# Patient Record
Sex: Female | Born: 1960 | Race: White | Hispanic: No | Marital: Married | Smoking: Never smoker
Health system: Southern US, Community
[De-identification: ages and names within clinical notes are randomized; demographics above are authoritative.]

## PROBLEM LIST (undated history)

## (undated) DIAGNOSIS — J302 Other seasonal allergic rhinitis: Secondary | ICD-10-CM

## (undated) DIAGNOSIS — H8109 Meniere's disease, unspecified ear: Secondary | ICD-10-CM

## (undated) HISTORY — DX: Meniere's disease, unspecified ear: H81.09

## (undated) HISTORY — DX: Other seasonal allergic rhinitis: J30.2

---
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
## 1981-08-16 HISTORY — PX: HAND SURGERY: SHX662

## 2004-06-18 ENCOUNTER — Ambulatory Visit: Payer: Self-pay | Admitting: Family Medicine

## 2005-07-07 ENCOUNTER — Ambulatory Visit: Payer: Self-pay | Admitting: Unknown Physician Specialty

## 2006-09-27 ENCOUNTER — Ambulatory Visit: Payer: Self-pay | Admitting: Unknown Physician Specialty

## 2007-10-03 ENCOUNTER — Ambulatory Visit: Payer: Self-pay | Admitting: Unknown Physician Specialty

## 2008-06-13 ENCOUNTER — Emergency Department: Payer: Self-pay | Admitting: Emergency Medicine

## 2008-11-08 ENCOUNTER — Ambulatory Visit: Payer: Self-pay | Admitting: Unknown Physician Specialty

## 2008-11-19 ENCOUNTER — Ambulatory Visit: Payer: Self-pay | Admitting: Unknown Physician Specialty

## 2009-12-02 ENCOUNTER — Ambulatory Visit: Payer: Self-pay | Admitting: Unknown Physician Specialty

## 2009-12-23 ENCOUNTER — Ambulatory Visit: Payer: Self-pay | Admitting: Unknown Physician Specialty

## 2010-12-25 ENCOUNTER — Ambulatory Visit: Payer: Self-pay | Admitting: Unknown Physician Specialty

## 2012-05-01 ENCOUNTER — Ambulatory Visit: Payer: Self-pay | Admitting: Physician Assistant

## 2015-08-13 ENCOUNTER — Other Ambulatory Visit: Payer: Self-pay | Admitting: Neurology

## 2015-08-13 DIAGNOSIS — G43809 Other migraine, not intractable, without status migrainosus: Secondary | ICD-10-CM

## 2015-08-13 DIAGNOSIS — G43109 Migraine with aura, not intractable, without status migrainosus: Principal | ICD-10-CM

## 2015-09-04 ENCOUNTER — Ambulatory Visit
Admission: RE | Admit: 2015-09-04 | Discharge: 2015-09-04 | Disposition: A | Discharge status: Home / Self Care | Payer: BLUE CROSS/BLUE SHIELD | Source: Ambulatory Visit | Attending: Neurology | Admitting: Neurology

## 2015-09-04 DIAGNOSIS — H9201 Otalgia, right ear: Secondary | ICD-10-CM | POA: Diagnosis not present

## 2015-09-04 DIAGNOSIS — J029 Acute pharyngitis, unspecified: Secondary | ICD-10-CM | POA: Diagnosis not present

## 2015-09-04 DIAGNOSIS — H9311 Tinnitus, right ear: Secondary | ICD-10-CM | POA: Insufficient documentation

## 2015-09-04 DIAGNOSIS — R42 Dizziness and giddiness: Secondary | ICD-10-CM | POA: Diagnosis not present

## 2015-09-04 DIAGNOSIS — R51 Headache: Secondary | ICD-10-CM | POA: Insufficient documentation

## 2015-09-04 DIAGNOSIS — G43109 Migraine with aura, not intractable, without status migrainosus: Secondary | ICD-10-CM | POA: Diagnosis not present

## 2015-09-04 DIAGNOSIS — G43809 Other migraine, not intractable, without status migrainosus: Secondary | ICD-10-CM

## 2015-09-04 MED ORDER — GADOBENATE DIMEGLUMINE 529 MG/ML IV SOLN
15.0000 mL | Freq: Once | INTRAVENOUS | Status: AC | PRN
  Administered 2015-09-04: 14 mL via INTRAVENOUS

## 2016-06-23 ENCOUNTER — Other Ambulatory Visit: Payer: Self-pay | Admitting: Physician Assistant

## 2016-06-23 DIAGNOSIS — Z1231 Encounter for screening mammogram for malignant neoplasm of breast: Secondary | ICD-10-CM

## 2016-07-21 ENCOUNTER — Ambulatory Visit: Payer: BLUE CROSS/BLUE SHIELD | Attending: Physician Assistant

## 2016-07-21 DIAGNOSIS — R42 Dizziness and giddiness: Secondary | ICD-10-CM | POA: Insufficient documentation

## 2016-07-21 DIAGNOSIS — R2681 Unsteadiness on feet: Secondary | ICD-10-CM | POA: Diagnosis present

## 2016-07-21 NOTE — Therapy (Signed)
[place] MAIN [facility] SERVICES [address] [place], [place], [address] Phone: [phone]   Fax:  [phone]  Physical Therapy Evaluation  Patient Details  Name: Helen Trevino MRN: [id number] Date of Birth: October 10, 1960 Referring Provider: Paulita Cradle PA-C  Encounter Date: 07/21/2016      PT End of Session - 07/21/16 1640    Visit Number 1   Number of Visits 5   Date for PT Re-Evaluation 08/18/16   Authorization Type no go codes   PT Start Time 0800   PT Stop Time 0900   PT Time Calculation (min) 60 min   Equipment Utilized During Treatment Gait belt   Activity Tolerance Patient tolerated treatment well   Behavior During Therapy [facility] for tasks assessed/performed      History reviewed. No pertinent past medical history.  History reviewed. No pertinent surgical history.  There were no vitals filed for this visit.       Subjective Assessment - 07/21/16 1635    Subjective Vestibular migraines   Pertinent History Pt reports she has a history of vestibular migraines for approximately 20 years. She gets both dizziness and vertigo prior to, during, and following her migraines. Pt reports that she has been to ENT twice and tested negative for positional vertigo. She has also had a VNG at ENT which was negative for peripheral disorders. She has also seen ENT at [facility] who ruled her out for Meniere's disease. Pt is working on a food diary currently to try to help in finding triggers related to her migraines. She knows that raw onions, bananas, and citrus bring on her migraines. Pt has never tried vestibular therapy before. She has a remote history of PT for back pain.      Limitations Walking   Diagnostic tests MRI: WNL, normal IAC   Patient Stated Goals Decrease dizziness and improve unsteadiness with walking   Currently in Pain? No/denies            [facility] PT Assessment - 07/21/16 0846      Assessment   Medical Diagnosis Vertigo    Referring Provider Paulita Cradle PA-C   Onset Date/Surgical Date 07/21/86  Approximate   Next MD Visit Not reported on this date   Prior Therapy Remote history of PT for back pain     Precautions   Precautions None     Restrictions   Weight Bearing Restrictions No     Balance Screen   Has the patient fallen in the past 6 months No   Has the patient had a decrease in activity level because of a fear of falling?  No   Is the patient reluctant to leave their home because of a fear of falling?  No     Home Ecologist residence   Living Arrangements Spouse/significant other   Available Help at Discharge Family     Prior Function   Level of [employer] at home   Leisure Travel and [place]   Overall Cognitive Status Within Functional Limits for tasks assessed     Observation/Other Assessments   Other Surveys  Other Surveys     Standardized Balance Assessment   Standardized Balance Assessment Dynamic Gait Index     Dynamic Gait Index   Level Surface Normal   Change in Gait Speed Normal   Gait with Horizontal Head Turns Moderate Impairment   Gait with Vertical Head Turns  Moderate Impairment   Gait and Pivot Turn Normal   Step Over Obstacle Normal   Step Around Obstacles Normal   Steps Mild Impairment   Total Score 19       VESTIBULAR AND BALANCE EVALUATION  Onset Date: 20 years ago  HISTORY:  Subjective history of current problem: Pt reports she has a history of vestibular migraines for approximately 20 years. She gets both dizziness and vertigo prior to, during, and following her migraines. Pt reports that she has been to ENT twice and tested negative for positional vertigo. She has also had a VNG at ENT which was negative for peripheral disorders. She has also seen ENT at [facility] who ruled her out for Meniere's disease. Pt is working on a food diary currently to try to help in finding triggers  related to her migraines. She knows that raw onions, bananas, and citrus bring on her migraines. Pt has never tried vestibular therapy before. She has a remote history of PT for back pain.    Description of dizziness: (vertigo, unsteadiness, lightheadedness, falling, general unsteadiness, aural fullness): vertigo, unsteadiness Frequency: Multiple times per week, there are periods where she experiences symptoms daily Duration: varies from hours to days Symptom nature: (motion provoked/positional/spontaneous/constant, variable, intermittent): motion-provoked, positional, spontaneous, constant  Provocative Factors: travel, food, migraines Easing Factors: sleep, phenergan, ibuprofen, Topomax, pt states she doesn't do well with typical migraine meds because they make her heart race which makes her dizziness worse.   Progression of symptoms: (better, worse, no change since onset) variable. Pt states she has had periods where they are better temporarily and other times when they are much worse. History of similar episodes: Ongoing for approximately 20 years  Falls (yes/no): No  Number of falls in past 6 months: No   Prior Functional Level: Independent   Auditory complaints (tinnitus, pain, drainage): tinnitis (more on right side), migraines start with R ear pain, no drainage from ears or aural fullness. Reports times when she feels like she has trouble hearing in crowded environments. Vision (last eye exam, diplopia, recent changes): blurring of vision during migraines, light sensitivity, denies diplopia, last eye exam 1 year ago (optomotrist added coating to reduce blue light to glasses);  Current Symptoms: vertigo, N & V, concentration issues, headache, rocking, general unsteadiness, imbalance, dizzy, migraines Denies: dysarthria, dysphagia, bowel bladder control, recent weight loss/gain, lightheadedness, oscillopsia  Review of systems negative for red flags.   EXAMINATION  POSTURE:   WNL  NEUROLOGICAL SCREEN: (2+ unless otherwise noted.) N=normal  Ab=abnormal  Level Dermatome R L Myotome R L Reflex R L  C3 Anterior Neck N N Sidebend C2-3 N N Jaw CN V    C4 Top of Shoulder N N Shoulder Shrug C4 N N Hoffman's UMN    C5 Lateral Upper Arm N N Shoulder ABD C4-5 N N Biceps C5-6    C6 Lateral Arm/ Thumb N N Arm Flex/ Wrist Ext C5-6 N N Brachiorad. C5-6    C7 Middle Finger N N Arm Ext//Wrist Flex C6-7 N N Triceps C7    C8 4th & 5th Finger N N Flex/ Ext Carpi Ulnaris C8 N N Patella    T1 Medial Arm N N Interossei T1 N N Gastrocnemius    L2 Medial thigh/groin N N Illiopsoas (L2-3) N N     L3 Lower thigh/med.knee N N Quadriceps (L3-4) N N Patellar (L3-4)    L4 Medial leg/lat thigh N N Tibialis Ant (L4-5) N N     L5 Lat.  leg & dorsal foot N N EHL (L5) N N     S1 post/lat foot/thigh/leg N N Gastrocnemius (S1-2) N N Gastrocnemius (S1)    S2 Post./med. thigh & leg N N Hamstrings (L4-S3) N N Babinski     Reflex testing deferred at this time  SOMATOSENSORY:         Sensation           Intact      Diminished         Absent  Light touch Normal    Any N & T in extremities or weakness: Intact      COORDINATION: Finger to Nose: Normal Pronator Drift: Normal   MUSCULOSKELETAL SCREEN: Cervical Spine ROM: WFL, slow and guarded with pulling in posterior neck during flexion;   ROM: WNL  MMT: Grossly 4+ to 5/5 throughout UE/LE. No focal weakness.   Functional Mobility: Independent for transfers and ambulation without assistive device.   Gait: Scanning of visual environment with gait is: decreased. Pt reports she is self limiting with head turns during ambulation because she knows that it causes her to become unsteady  Balance: Severe gait deviations with horizontal and vertical head turns during ambulation   OCULOMOTOR / VESTIBULAR TESTING:  Oculomotor Exam- Room Light  Normal Abnormal Comments  Ocular Alignment N    Ocular ROM N    Spontaneous Nystagmus N    End-Gaze  Nystagmus N  Appropriate end-range  Smooth Pursuit N  Pt reports some nausea  Saccades N    VOR  A Reports dizziness and nausea.  VOR Cancellation N    Left Head Thrust  A Positive 2/2 trials  Right Head Thrust N    Head Shaking Nystagmus N    Static Acuity 20/13    Dynamic Acuity 20/20  Two line loss    Oculomotor Exam- Fixation Suppressed: Deferred due to bright lights triggering migraines  BPPV TESTS:  Symptoms Duration Intensity Nystagmus  L Dix-Hallpike Negative   Negative  R Dix-Hallpike Negative   Negative  L Head Roll Negative   Negative  R Head Roll Negative   Negative  L Sidelying Test      R Sidelying Test        FUNCTIONAL OUTCOME MEASURES:  Results Comments  DHI  Did not complete correctly  ABC Scale  Will complete at next session  DGI 19/24 Difficulty with head turns  10 meter Walking Speed >1.2 m/s WNL   POSTURAL CONTROL TESTS:   Clinical Test of Sensory Interaction for Balance    (CTSIB):  CONDITION TIME STRATEGY SWAY  Eyes open, firm surface 30s  1+  Eyes closed, firm surface 30s  2+  Eyes open, foam surface 30s  2+  Eyes closed, foam surface 4s  4+      TREATMENT  NEUROMUSCULAR RE-EDUCATION Pt issued VOR x 1 horizontal in sitting. Perform with patient x 60 seconds. She reports significant increase in her dizziness and nausea after approximately 30 seconds. Pt was told to limit HEP to 30 seconds. Handout with education provided to patient and spouse.        .                     PT Education - 07/21/16 1639    Education provided Yes   Education Details HEP, plan of care   Person(s) Educated Patient;Spouse   Methods Explanation   Comprehension Verbalized understanding  PT Long Term Goals - 07/21/16 1702      PT LONG TERM GOAL #1   Title  Pt will be independent with HEP in order to improve strength and balance in order to decrease fall risk and improve function at home and work   Time 4   Period Weeks    Status New     PT LONG TERM GOAL #2   Title Pt will report at least 50% reduction in symptoms in order to improve function at home and ability to travel/hike   Time 4   Period Weeks   Status New     PT LONG TERM GOAL #3   Title  Pt will improve DGI by at least 3 points in order to demonstrate clinically significant improvement in balance and decreased risk for falls    Baseline 07/21/16: 19/24   Time 4   Period Weeks   Status New               Plan - 07/21/16 1641    Clinical Impression Statement Pt is a pleasant 55 yo female referred for vertigo. She reports a 20 year history of vestibular migraines which trigger her vertigo and dizziness. She has been to ENT twice and tested negative both times for positional vertigo. She has also had a VNG which was negative. 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 However pt reports negative VNG testing in the past. Her dynamic visual acuity is also only a two line loss which is not significant for a peripheral loss. She struggles with balance on compliant surfaces with eyes closed and demonstrates severe lateral gait deviations and gait slowing with horizontal and vertical head turns. Pt will benefit from trial of skilled vestibular therapy to improve dizziness and head turns with ambulation   Rehab Potential Fair   Clinical Impairments Affecting Rehab Potential Positive: motivation, age; Negative: chronicity, vestibular migraines   PT Frequency 1x / week   PT Duration 4 weeks   PT Treatment/Interventions ADLs/Self Care Home Management;Canalith Repostioning;Cryotherapy;Electrical Stimulation;Iontophoresis 4mg /ml Dexamethasone;Traction;Moist Heat;Ultrasound;DME Instruction;Gait training;Stair training;Functional mobility training;Therapeutic activities;Therapeutic exercise;Balance training;Neuromuscular re-education;Patient/family education;Manual techniques;Passive range  of motion;Vestibular   PT Next Visit Plan Have pt complete ABC and DHI (did not complete DHI correctly), progress VOR and issue a habituation exercise to her HEP   PT Home Exercise Plan VOR x 1 horizontal in sitting 30 seconds x 3, 4 times/day, encouraged to modify as appropriate to minimize severity of symptoms and migraines   Consulted and Agree with Plan of Care Patient;Family member/caregiver   Family Member Consulted Husband      Patient will benefit from skilled therapeutic intervention in order to improve the following deficits and impairments:  Dizziness, Decreased balance  Visit Diagnosis: Dizziness and giddiness - Plan: PT plan of care cert/re-cert  Unsteadiness on feet - Plan: PT plan of care cert/re-cert     Problem List There are no active problems to display for this patient.  Phillips Grout PT, DPT   Ryle Buscemi 07/21/2016, 5:25 PM  [place] MAIN [facility] SERVICES [address] [place], [place], [address] Phone: [phone]   Fax:  [phone]  Name: Helen Trevino MRN: [id number] Date of Birth: [birth year]

## 2016-07-28 ENCOUNTER — Ambulatory Visit: Payer: BLUE CROSS/BLUE SHIELD

## 2016-07-28 DIAGNOSIS — R42 Dizziness and giddiness: Secondary | ICD-10-CM

## 2016-07-28 DIAGNOSIS — R2681 Unsteadiness on feet: Secondary | ICD-10-CM

## 2016-07-28 NOTE — Patient Instructions (Signed)
Feet Heel-Toe "Semi-Tandem", Head Motion - Eyes Open    With eyes open, right foot directly in front of the other but slightly out to the side, move head slowly: left and right for 30 seconds, up and down for 30 seconds, and then turn head with body for 30 seconds. Switch forward leg and repeat entire sequence. Perform 3-4 times/day.    Gaze Stabilization  Progress to standing with feet together on a firm surface with target on a plain background. Start with 30 seconds x 3, 4 times/day and progress until you can perform for 1 full minute

## 2016-07-28 NOTE — Therapy (Signed)
[place] MAIN [facility] SERVICES [address] [place], [place], [address] Phone: [phone]   Fax:  [phone]  Physical Therapy Treatment  Patient Details  Name: Helen Trevino MRN: [id number] Date of Birth: 03/13/1961 Referring Provider: Paulita Cradle PA-C  Encounter Date: 07/28/2016      PT End of Session - 07/28/16 1522    Visit Number 2   Number of Visits 5   Date for PT Re-Evaluation 08/18/16   Authorization Type no go codes   PT Start Time 11-19-16   PT Stop Time 1600   PT Time Calculation (min) 42 min   Equipment Utilized During Treatment Gait belt   Activity Tolerance Patient tolerated treatment well   Behavior During Therapy [facility] for tasks assessed/performed      History reviewed. No pertinent past medical history.  History reviewed. No pertinent surgical history.  There were no vitals filed for this visit.      Subjective Assessment - 07/28/16 1519    Subjective Pt reports that she was able to progressively increase VOR x 1 horizontal up to 1 full minute. Reports decreasing severity of symptoms with increased time during exercise. She is consistently performing her HEP. No specific questions or concerns at this time.    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 She gets both dizziness and vertigo prior to, during, and following her migraines. Pt reports that she has been to ENT twice and tested negative for positional vertigo. She has also had a VNG at ENT which was negative for peripheral disorders. She has also seen ENT at [facility] who ruled her out for Meniere's disease. Pt is working on a food diary currently to try to help in finding triggers related to her migraines. She knows that raw onions, bananas, and citrus bring on her migraines. Pt has never tried vestibular therapy before. She has a remote history of PT for back pain.      Limitations Walking   Diagnostic tests  MRI: WNL, normal IAC   Patient Stated Goals Decrease dizziness and improve unsteadiness with walking   Currently in Pain? Yes   Pain Score 3    Pain Location Head   Pain Orientation --  headache near crown of head   Pain Type Chronic pain   Pain Onset More than a month ago   Pain Frequency Intermittent        TREATMENT  VOR X 1 VOR x 1 supported sitting x 1 minute (5/10 dizziness), pt demonstrates good technique and doesn't require cues; VOR x 1 standing feet together plain background 30 seconds x 2, pt only able to tolerate approximately 30 seconds due to difficulty tolerating severe increase in dizziness. Cannot rate on 0-10 scale but reports worse than sitting;  Semitandem Progressions Semitandem with horizontal and vertical head turns alternating LE forward x 30 seconds each; Semitandem with horizontal head and body turns alternating LE forward x 30 seconds each;  Airex Airex feet together eyes closed 30 seconds x 2 Airex feet apart eyes closed marches 30 seconds x 2;   Airex Balance Beam Side stepping with horizontal and vertical head turns x multiple bouts;  Hallway Ambulation Forward ambulation with horizontal ball passes to self with head/eye follow 75' x 2; Forward ambulation with vertical ball tosses to self with head/eye follow 75' x 2;  Pt provided written HEP with education about how to progress VOR and how to perform semitandem progressions  with head/body turns.                          PT Education - 07/28/16 1521    Education provided Yes   Education Details HEP progression   Person(s) Educated Patient   Methods Explanation   Comprehension Verbalized understanding             PT Long Term Goals - 07/21/16 1702      PT LONG TERM GOAL #1   Title  Pt will be independent with HEP in order to improve strength and balance in order to decrease fall risk and improve function at home and work   Time 4   Period Weeks   Status New      PT LONG TERM GOAL #2   Title Pt will report at least 50% reduction in symptoms in order to improve function at home and ability to travel/hike   Time 4   Period Weeks   Status New     PT LONG TERM GOAL #3   Title  Pt will improve DGI by at least 3 points in order to demonstrate clinically significant improvement in balance and decreased risk for falls    Baseline 07/21/16: 19/24   Time 4   Period Weeks   Status New               Plan - 07/28/16 1524    Clinical Impression Statement Pt able to perform VOR x 1 horizontal exercise in sitting for 1 full minute during session today. Progressed to standing with feet together however pt unable to tolerate for 1 full minute. Pt encouraged to perform in standing for HEP but for only 30 seconds. Pt also issued semitandem progressions with head/body turns to add to HEP. Pt encouraged to follow-up as scheduled.   Rehab Potential Fair   Clinical Impairments Affecting Rehab Potential Positive: motivation, age; Negative: chronicity, vestibular migraines   PT Frequency 1x / week   PT Duration 4 weeks   PT Treatment/Interventions ADLs/Self Care Home Management;Canalith Repostioning;Cryotherapy;Electrical Stimulation;Iontophoresis 4mg /ml Dexamethasone;Traction;Moist Heat;Ultrasound;DME Instruction;Gait training;Stair training;Functional mobility training;Therapeutic activities;Therapeutic exercise;Balance training;Neuromuscular re-education;Patient/family education;Manual techniques;Passive range of motion;Vestibular   PT Next Visit Plan Have pt complete ABC and DHI (did not complete DHI correctly at initial evaluation and forgot to give it to her first treatment), progress VOR as tolerated, continue with head turning activities with ball during ambulation, consider adding quick turns   PT Home Exercise Plan VOR x 1 horizontal in standing with feet apart plain background 30 seconds x 3, 4 times/day, semitandem progression with horizontal and vertical  head turns with horizontal head and body turns   Consulted and Agree with Plan of Care Patient   Family Member Consulted Husband      Patient will benefit from skilled therapeutic intervention in order to improve the following deficits and impairments:  Dizziness, Decreased balance  Visit Diagnosis: Dizziness and giddiness  Unsteadiness on feet     Problem List There are no active problems to display for this patient.  Phillips Grout PT, DPT   Helen Trevino 07/28/2016, 4:45 PM  [place] MAIN [facility] SERVICES [address] [place], [place], [address] Phone: [phone]   Fax:  [phone]  Name: Helen Trevino MRN: [id number] Date of Birth: 07-Jan-1961

## 2016-07-29 ENCOUNTER — Ambulatory Visit
Admission: RE | Admit: 2016-07-29 | Discharge: 2016-07-29 | Disposition: A | Discharge status: Home / Self Care | Payer: BLUE CROSS/BLUE SHIELD | Source: Ambulatory Visit | Attending: Physician Assistant | Admitting: Physician Assistant

## 2016-07-29 ENCOUNTER — Encounter: Payer: Self-pay | Admitting: Radiology

## 2016-07-29 DIAGNOSIS — Z1231 Encounter for screening mammogram for malignant neoplasm of breast: Secondary | ICD-10-CM | POA: Diagnosis not present

## 2016-08-04 ENCOUNTER — Encounter: Payer: BLUE CROSS/BLUE SHIELD | Admitting: Physical Therapy

## 2016-08-11 ENCOUNTER — Encounter: Payer: Self-pay | Admitting: Physical Therapy

## 2016-08-11 ENCOUNTER — Ambulatory Visit: Payer: BLUE CROSS/BLUE SHIELD | Admitting: Physical Therapy

## 2016-08-11 DIAGNOSIS — R42 Dizziness and giddiness: Secondary | ICD-10-CM | POA: Diagnosis not present

## 2016-08-11 DIAGNOSIS — R2681 Unsteadiness on feet: Secondary | ICD-10-CM

## 2016-08-11 NOTE — Therapy (Signed)
[place] MAIN [facility] SERVICES [address] [place], [place], [address] Phone: [phone]   Fax:  [phone]  Physical Therapy Treatment  Patient Details  Name: Helen Trevino MRN: [id number] Date of Birth: 11-Sep-1960 Referring Provider: Paulita Cradle PA-C  Encounter Date: 08/11/2016      PT End of Session - 08/11/16 1011    Visit Number 3   Number of Visits 5   Date for PT Re-Evaluation 08/18/16   Authorization Type no go codes   PT Start Time 0950   PT Stop Time 1039   PT Time Calculation (min) 49 min   Equipment Utilized During Treatment Gait belt   Activity Tolerance Patient tolerated treatment well   Behavior During Therapy [facility] for tasks assessed/performed      History reviewed. No pertinent past medical history.  History reviewed. No pertinent surgical history.  There were no vitals filed for this visit.      Subjective Assessment - 08/11/16 0955    Subjective Patient states she has been trying to do her HEP, but states she had the flu or some other illness and spent 5-6 days in bed and then she experienced headaches so she has not been able to do HEP.  Patient reports that her migraines vary this time of year and in the Spring. Patient states the last few weeks her migraines have been worse since her illness with pulsating headache, nausea and "can't see". Patient reports that she has been adhering to a migraine restricted diet and she has been keeping a migraine diary. Patient states she has identified numerous food triggers.    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 She gets both dizziness and vertigo prior to, during, and following her migraines. Pt reports that she has been to ENT twice and tested negative for positional vertigo. She has also had a VNG at ENT which was negative for peripheral disorders. She has also seen ENT at [facility] who ruled her out for Meniere's  disease. Pt is working on a food diary currently to try to help in finding triggers related to her migraines. She knows that raw onions, bananas, and citrus bring on her migraines. Pt has never tried vestibular therapy before. She has a remote history of PT for back pain.      Limitations Walking   Diagnostic tests MRI: WNL, normal IAC   Patient Stated Goals Decrease dizziness and improve unsteadiness with walking   Currently in Pain? No/denies   Pain Onset More than a month ago      Neuromuscular Re-education:  VOR x 1 exercise:  Patient performed VOR X 1 horiz in sitting 2 reps of 1 minute each, one on plain background and one on busy visual background with verbal cues for technique. Patient reports 4/10 dizziness with VOR X 1 with busy background.   VOR x 2 exercise:  Demonstrated and educated as to VOR X 2. Patient performed VOR X 2 horiz in sitting 2 reps of  30 seconds each with verbal cues for technique. Patient reports the target is staying in focus and denies any increase in his dizziness levels.   Walking while scanning for visual targets: Performed 170' trials of forwards and retro ambulation while scanning for visual targets in hallway with CGA.  Ambulation with head turns:  Patient performed 175' trials of forwards and retro ambulation with horiz, vert and diagonal head turns with CGA.  Patient demonstrates  mild veering at times with retro ambulation and symptoms of imbalance. Patient reports she is not having any nausea with these activities which she states is an improvement.   Dizziness Handicap Inventory:  Patient completed [place] (non-billed). Patient scored 56/100 on DHI indicating moderate perception of handicap, in need of intervention.       PT Education - 08/11/16 0957    Education provided Yes   Education Details Discussed plan of care and symptoms; discussed HEP and added VOR X 2   Person(s) Educated Patient;Spouse   Methods Explanation;Handout;Verbal  cues;Demonstration   Comprehension Verbalized understanding;Returned demonstration;Verbal cues required             PT Long Term Goals - 08/11/16 1450      PT LONG TERM GOAL #1   Title  Pt will be independent with HEP in order to improve strength and balance in order to decrease fall risk and improve function at home and work   Time 4   Period Weeks   Status On-going     PT LONG TERM GOAL #2   Title Pt will report at least 50% reduction in symptoms in order to improve function at home and ability to travel/hike   Time 4   Period Weeks   Status On-going     PT LONG TERM GOAL #3   Title  Pt will improve DGI by at least 3 points in order to demonstrate clinically significant improvement in balance and decreased risk for falls    Baseline 07/21/16: 19/24   Time 4   Period Weeks   Status On-going     PT LONG TERM GOAL #4   Title Patient will reduce perceived disability to low levels as indicated by <40 on Dizziness Handicap Inventory.   Baseline Patient scored 56/100 on 08/11/2016   Time 4   Period Weeks   Status New               Plan - 08/11/16 1011    Clinical Impression Statement Patient reports that she felt encouraged today because she was able to complete the activities without nausea and with tolerable dizziness symptoms. Patient encouraged to try to resume HEP. Patient challenged by VOR X 1 with busy background and by VOR X 2 exercises. Patient would benefit from continued progressions of gaze stability exercises. Patient noted to have mild veering and imbalance with ambulation with head turning activities as well. Patient would benefit from continued PT services to address goals and to address subjective symptoms.     Rehab Potential Fair   Clinical Impairments Affecting Rehab Potential Positive: motivation, age; Negative: chronicity, vestibular migraines   PT Frequency 1x / week   PT Duration 4 weeks   PT Treatment/Interventions ADLs/Self Care Home  Management;Canalith Repostioning;Cryotherapy;Electrical Stimulation;Iontophoresis 4mg /ml Dexamethasone;Traction;Moist Heat;Ultrasound;DME Instruction;Gait training;Stair training;Functional mobility training;Therapeutic activities;Therapeutic exercise;Balance training;Neuromuscular re-education;Patient/family education;Manual techniques;Passive range of motion;Vestibular   PT Next Visit Plan Have pt complete ABC and DHI (did not complete DHI correctly at initial evaluation and forgot to give it to her first treatment), progress VOR as tolerated, continue with head turning activities with ball during ambulation, consider adding quick turns   PT Home Exercise Plan VOR x 1 horizontal in standing with feet apart plain background 30 seconds x 3, 4 times/day, semitandem progression with horizontal and vertical head turns with horizontal head and body turns   Consulted and Agree with Plan of Care Patient   Family Member Consulted Husband      Patient will benefit  from skilled therapeutic intervention in order to improve the following deficits and impairments:  Dizziness, Decreased balance  Visit Diagnosis: Dizziness and giddiness  Unsteadiness on feet     Problem List There are no active problems to display for this patient.  Lady Deutscher PT, DPT Lady Deutscher 08/11/2016, 3:02 PM  [place] MAIN [facility] SERVICES [address] [place], [place], [address] Phone: [phone]   Fax:  [phone]  Name: Helen Trevino MRN: [id number] Date of Birth: 11-Feb-1961

## 2016-08-17 ENCOUNTER — Encounter: Payer: Self-pay | Admitting: Physical Therapy

## 2016-08-17 ENCOUNTER — Ambulatory Visit: Payer: BLUE CROSS/BLUE SHIELD | Attending: Physician Assistant | Admitting: Physical Therapy

## 2016-08-17 DIAGNOSIS — R2681 Unsteadiness on feet: Secondary | ICD-10-CM | POA: Diagnosis present

## 2016-08-17 DIAGNOSIS — R42 Dizziness and giddiness: Secondary | ICD-10-CM | POA: Insufficient documentation

## 2016-08-17 NOTE — Therapy (Signed)
[place] MAIN [facility] SERVICES [address] [place], [place], [address] Phone: [phone]   Fax:  [phone]  Physical Therapy Treatment  Patient Details  Name: Helen Trevino MRN: [id number] Date of Birth: 02-05-1961 Referring Provider: Paulita Cradle PA-C  Encounter Date: 08/17/2016      PT End of Session - 08/17/16 0948    Visit Number 4   Number of Visits 5   Date for PT Re-Evaluation 08/18/16   Authorization Type no go codes   PT Start Time 0945   PT Stop Time 1027   PT Time Calculation (min) 42 min   Equipment Utilized During Treatment Gait belt   Activity Tolerance Patient tolerated treatment well   Behavior During Therapy [facility] for tasks assessed/performed      History reviewed. No pertinent past medical history.  History reviewed. No pertinent surgical history.  There were no vitals filed for this visit.      Subjective Assessment - 08/17/16 0947    Subjective Patient states she has been doing okay. Patient states she had one migraine last week. Patient states she is not as shaky when doing her exercises now.    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 She gets both dizziness and vertigo prior to, during, and following her migraines. Pt reports that she has been to ENT twice and tested negative for positional vertigo. She has also had a VNG at ENT which was negative for peripheral disorders. She has also seen ENT at [facility] who ruled her out for Meniere's disease. Pt is working on a food diary currently to try to help in finding triggers related to her migraines. She knows that raw onions, bananas, and citrus bring on her migraines. Pt has never tried vestibular therapy before. She has a remote history of PT for back pain.      Limitations Walking   Diagnostic tests MRI: WNL, normal IAC   Patient Stated Goals Decrease dizziness and improve unsteadiness with walking   Currently in Pain? Yes   Pain Score 2    Pain Location Head   Pain Orientation Posterior   Pain Descriptors / Indicators Tightness   Pain Onset More than a month ago      Neuromuscular Re-education:   VOR x 2:  Patient performed VOR X 2 horiz in sitting 2 reps of 1 minute each and VOR X 2 vertical in sitting 2 reps of 1 minute each with verbal cues for technique. Patient reports her eyes are twitching and needs to take a rest break "x" target moving for a short while after she stops doing the exercise.    Bounce Passes: Patient performed ambulation 47' trials while doing alternating sides bounce passes to self with ball while tracking ball with eyes and head with CGA. Patient noted to have sidestepping veering at times during this exercise.    Diona Foley toss over shoulder: Patient performed multiple 8' trials of forward and retro ambulation while tossing ball over one shoulder with return catch over opposite shoulder with CGA.  Patient performed multiple 49' trials of forward and retro ambulation while tossing ball over one shoulder with return catch over opposite shoulder varying the ball position to head, shoulder and waist level to promote head turning and tilting. Patient with veering at times with this activity as well.   Quick Turns:  Patient performed 8 reps of walking 10' with alternating quick turns left and right. Patient  reports sensation of continued movement with quick turns and demonstrates increased sway after turning.   2" X 4" board:   On 2" X 4" board worked on static sideways stance with and without head turns horizontal and vertical and then worked on sidestepping L/R with and without horiz and vert head turns. Patient worked on forward tandem walking on firm surface and then on 2" x 4" board, 8' times multiple reps of each type.   Patient denies nausea throughout session this date and at end patient remarked that she noticed that she did not have any shaking today during  session which she reports is much better than it was initially.    ABC Scale: Patient scored 88.87% on ABC scale which indicates decreased falls risk.       PT Long Term Goals - 08/11/16 1450      PT LONG TERM GOAL #1   Title  Pt will be independent with HEP in order to improve strength and balance in order to decrease fall risk and improve function at home and work   Time 4   Period Weeks   Status On-going     PT LONG TERM GOAL #2   Title Pt will report at least 50% reduction in symptoms in order to improve function at home and ability to travel/hike   Time 4   Period Weeks   Status On-going     PT LONG TERM GOAL #3   Title  Pt will improve DGI by at least 3 points in order to demonstrate clinically significant improvement in balance and decreased risk for falls    Baseline 07/21/16: 19/24   Time 4   Period Weeks   Status On-going     PT LONG TERM GOAL #4   Title Patient will reduce perceived disability to low levels as indicated by <40 on Dizziness Handicap Inventory.   Baseline Patient scored 56/100 on 08/11/2016   Time 4   Period Weeks   Status New               Plan - 08/17/16 0948    Clinical Impression Statement Patient reporting compliance with home exercise program. Patient able to work on progressions of balance and vestibular exercises and patient reporting no increase in migraine frequency or intensity at present. Patient challenged by VOR X 2 progressions and is now able to progress to 1 minute trials. Patient would benefit from continued PT services to further address patient's symptoms and goals as set on plan of care.    Rehab Potential Fair   Clinical Impairments Affecting Rehab Potential Positive: motivation, age; Negative: chronicity, vestibular migraines   PT Frequency 1x / week   PT Duration 4 weeks   PT Treatment/Interventions ADLs/Self Care Home Management;Canalith Repostioning;Cryotherapy;Electrical Stimulation;Iontophoresis 4mg /ml  Dexamethasone;Traction;Moist Heat;Ultrasound;DME Instruction;Gait training;Stair training;Functional mobility training;Therapeutic activities;Therapeutic exercise;Balance training;Neuromuscular re-education;Patient/family education;Manual techniques;Passive range of motion;Vestibular   PT Next Visit Plan Hallway ball toss, 2 X 4 activties and repeat on Airex balance beam, Airex pad activity progressions, EO/EC activities   PT Home Exercise Plan VOR x 1 horizontal in standing with feet apart plain background 30 seconds x 3, 4 times/day, semitandem progression with horizontal and vertical head turns with horizontal head and body turns; VOR X 2 in sitting horizontal and vertical 1 minute reps   Consulted and Agree with Plan of Care Patient   Family Member Consulted Husband      Patient will benefit from skilled therapeutic intervention in order to improve the  following deficits and impairments:  Dizziness, Decreased balance  Visit Diagnosis: Dizziness and giddiness  Unsteadiness on feet     Problem List There are no active problems to display for this patient.   Ida Uppal 08/17/2016, 10:44 AM  [place] MAIN [facility] SERVICES [address] [place], [place], [address] Phone: [phone]   Fax:  [phone]  Name: Helen Trevino MRN: [id number] Date of Birth: [birth year]

## 2016-08-24 ENCOUNTER — Ambulatory Visit: Payer: BLUE CROSS/BLUE SHIELD | Admitting: Physical Therapy

## 2016-08-24 ENCOUNTER — Encounter: Payer: Self-pay | Admitting: Physical Therapy

## 2016-08-24 DIAGNOSIS — R2681 Unsteadiness on feet: Secondary | ICD-10-CM

## 2016-08-24 DIAGNOSIS — R42 Dizziness and giddiness: Secondary | ICD-10-CM | POA: Diagnosis not present

## 2016-08-24 NOTE — Therapy (Signed)
[place] MAIN [facility] SERVICES [address] [place], [place], [address] Phone: [phone]   Fax:  [phone]  Physical Therapy Treatment  Patient Details  Name: Helen Trevino MRN: [id number] Date of Birth: 02/12/1961 Referring Provider: Paulita Cradle PA-C  Encounter Date: 08/24/2016      PT End of Session - 08/24/16 1034    Visit Number 5   Number of Visits 11   Date for PT Re-Evaluation 09/29/16   Authorization Type no go codes   PT Start Time 0943   PT Stop Time 1030   PT Time Calculation (min) 47 min   Activity Tolerance Patient tolerated treatment well   Behavior During Therapy [facility] for tasks assessed/performed      History reviewed. No pertinent past medical history.  History reviewed. No pertinent surgical history.  There were no vitals filed for this visit.      Subjective Assessment - 08/24/16 0948    Subjective Patient states she has had headaches on 1/1 and 1/6-1/7. Patient states on the 6th the headache was not as "crippling". Patient states her dizziness is a little better. Patient states she is now able to continue her activities when she feels the dizziness coming on but states it is not stopping her.    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 She gets both dizziness and vertigo prior to, during, and following her migraines. Pt reports that she has been to ENT twice and tested negative for positional vertigo. She has also had a VNG at ENT which was negative for peripheral disorders. She has also seen ENT at [facility] who ruled her out for Meniere's disease. Pt is working on a food diary currently to try to help in finding triggers related to her migraines. She knows that raw onions, bananas, and citrus bring on her migraines. Pt has never tried vestibular therapy before. She has a remote history of PT for back pain.      Limitations Walking   Diagnostic tests MRI: WNL,  normal IAC   Patient Stated Goals Decrease dizziness and improve unsteadiness with walking   Currently in Pain? No/denies   Pain Onset More than a month ago            [facility] PT Assessment - 08/24/16 0957      Standardized Balance Assessment   Standardized Balance Assessment Dynamic Gait Index     Dynamic Gait Index   Level Surface Normal   Change in Gait Speed Normal   Gait with Horizontal Head Turns Normal   Gait with Vertical Head Turns Mild Impairment   Gait and Pivot Turn Normal   Step Over Obstacle Normal   Step Around Obstacles Normal   Steps Mild Impairment   Total Score 22      Neuromuscular Re-education:  VOR x 2:  Patient performed VOR X 2 vertical in sitting with conflicting background one rep of 45 seconds and 1 rep of 30 seconds. Patient reports 7/10 dizziness and states she feels like she can still see the background moving when she closes her eyes upon stopping.   Hallway ball toss: In hallway, worked on ball toss against one wall with alternating quick turns to toss ball against opposite wall while tracking with eyes and head.  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 Patient had difficulty with maintaining balance when added vert head turns while sidestepping. Patient required CGA to min A with activities on 2 X 4" board.   FUNCTIONAL OUTCOME MEASURES:  Results Comments  DHI 44/100 Moderate perception of handicap; in need of intervention  DGI 22/24 Safe for community mobility   Discussed functional outcome measures and compared to prior testing. Discussed plan of care and progress towards goals and added additional goals to POC.        PT Education - 08/24/16 [phone]    Education provided Yes   Education Details discussed plan of care and goals; discussed adding EC with  feet together and semi-tandem progressions on firm surface static holds for HEP and discussed safety precautions   Person(s) Educated Patient   Methods Explanation;Demonstration   Comprehension Verbalized understanding;Returned demonstration             PT Long Term Goals - 08/24/16 0953      PT LONG TERM GOAL #1   Title  Pt will be independent with HEP in order to improve strength and balance in order to decrease fall risk and improve function at home and work   Time 4   Period Weeks   Status Achieved     PT LONG TERM GOAL #2   Title Pt will report at least 50% reduction in symptoms in order to improve function at home and ability to travel/hike   Baseline patient rates 50-55% improvement in her symptoms since beginning therapy on 08/24/2016   Time 4   Period Weeks   Status Achieved     PT LONG TERM GOAL #3   Title  Pt will improve DGI by at least 3 points in order to demonstrate clinically significant improvement in balance and decreased risk for falls    Baseline 07/21/16: 19/24; scored on 08/24/2016 22/24   Time 4   Period Weeks   Status Achieved     PT LONG TERM GOAL #4   Title Patient will reduce perceived disability to low levels as indicated by <40 on Dizziness Handicap Inventory.   Baseline Patient scored 56/100 on 08/11/2016; scored on 08/24/2016 44/100    Time 4   Period Weeks   Status Partially Met     PT LONG TERM GOAL #5   Title Pt will report at least 75% reduction in symptoms in order to improve function at home and ability to travel/hike.   Baseline reported 50-55% on 08/24/2016   Time 6   Period Weeks   Status New     Additional Long Term Goals   Additional Long Term Goals Yes     PT LONG TERM GOAL #6   Title Patient will be able to descend/ascend stairs without lateral veering and increase in dizziness symptoms in order to safely be able to navigate stairs at her church and in the community.    Time 6   Period Weeks   Status New     PT LONG TERM GOAL  #7   Title Patient will be able to ambulate with vertical head turns without significant lateral deviations and change in gait speed to improve community mobility safety.    Time 6   Period Weeks   Status New               Plan - 08/24/16 1035    Clinical Impression Statement Patient reporting that she has had 2  headaches since January first and she feels the intensity of her headaches has lessened. Patient states she feels she has had a 50-55% improvement in her overall symptoms since initiating therapy services. Patient stating she is learning to not limit her actvities and states she is better able to tolerate activities now. Patient has met 3/4 goals as set on plan of care. Patient improved from 19/24 to 22/24 on the DGI.  Patient  improved from 56/100 to 44/100 on the [facility]. Patient would like to continue PT services at this time to work on updated goals and to see if further reductions in her symptoms can be achieved. Patient motivated to session and has been demonstrating good progress wit therapy will plan on continuing PT services at this time.    Rehab Potential Fair   Clinical Impairments Affecting Rehab Potential Positive: motivation, age; Negative: chronicity, vestibular migraines   PT Frequency 1x / week   PT Duration 6 weeks   PT Treatment/Interventions ADLs/Self Care Home Management;Canalith Repostioning;Cryotherapy;Electrical Stimulation;Iontophoresis 103m/ml Dexamethasone;Traction;Moist Heat;Ultrasound;DME Instruction;Gait training;Stair training;Functional mobility training;Therapeutic activities;Therapeutic exercise;Balance training;Neuromuscular re-education;Patient/family education;Manual techniques;Passive range of motion;Vestibular   PT Next Visit Plan 2 X 4 activties and repeat on Airex balance beam, Airex pad EO/EC activities, stairs with head turns as tolerated; consider optokinetic stripes or activities with guaze or sunglasses donned to recreate lowlight  environments-patient reporting difficulty driving in car when sunlight filters in through trees,etc and the light levels change   PT Home Exercise Plan VOR x 1 horizontal in standing with feet apart plain background 30 seconds x 3, 4 times/day, semitandem progression with horizontal and vertical head turns with horizontal head and body turns; VOR X 2 in sitting horizontal and vertical 1 minute reps; EC static holds 30 seconds on firm surface with feet together and semi-tandem progressions standing in corner for safetyi   Consulted and Agree with Plan of Care Patient   Family Member Consulted --      Patient will benefit from skilled therapeutic intervention in order to improve the following deficits and impairments:  Dizziness, Decreased balance  Visit Diagnosis: Dizziness and giddiness  Unsteadiness on feet     Problem List There are no active problems to display for this patient.  DLady DeutscherPT, DPT Torsha Lemus 08/24/2016, 10:54 AM  [place] [facility] [address] [address]Phone: [phone]  Fax:  [phone] Name: Helen POYNTERMRN: [id number] of Birth: 9Oct 13, 1962

## 2016-08-31 ENCOUNTER — Encounter: Payer: Self-pay | Admitting: Physical Therapy

## 2016-08-31 ENCOUNTER — Ambulatory Visit: Payer: BLUE CROSS/BLUE SHIELD | Admitting: Physical Therapy

## 2016-08-31 DIAGNOSIS — R2681 Unsteadiness on feet: Secondary | ICD-10-CM

## 2016-08-31 DIAGNOSIS — R42 Dizziness and giddiness: Secondary | ICD-10-CM | POA: Diagnosis not present

## 2016-08-31 NOTE — Therapy (Signed)
[facility] MAIN [facility] SERVICES [address] [place], [place], [address] Phone: [phone]   Fax:  [phone]  Physical Therapy Treatment  Patient Details  Name: Helen Trevino MRN: [id number] Date of Birth: [birth year] Referring Provider: Maurine Minister PA-C  Encounter Date: 08/31/2016      PT End of Session - 08/31/16 0816    Visit Number 6   Number of Visits 11   Date for PT Re-Evaluation 09/29/16   Authorization Type no go codes   PT Start Time 0810   Activity Tolerance Patient tolerated treatment well   Behavior During Therapy [facility] for tasks assessed/performed      History reviewed. No pertinent past medical history.  History reviewed. No pertinent surgical history.  There were no vitals filed for this visit.      Subjective Assessment - 08/31/16 0815    Subjective Patient states that she has been okay and states she had one headache this past week.    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 She gets both dizziness and vertigo prior to, during, and following her migraines. Pt reports that she has been to ENT twice and tested negative for positional vertigo. She has also had a VNG at ENT which was negative for peripheral disorders. She has also seen ENT at [facility] who ruled her out for Meniere's disease. Pt is working on a food diary currently to try to help in finding triggers related to her migraines. She knows that raw onions, bananas, and citrus bring on her migraines. Pt has never tried vestibular therapy before. She has a remote history of PT for back pain.      Limitations Walking   Diagnostic tests MRI: WNL, normal IAC   Patient Stated Goals Decrease dizziness and improve unsteadiness with walking   Currently in Pain? No/denies   Pain Onset More than a month ago      Neuromuscular Re-education:  VOR x 1:  Patient performed on Airex pad VOR X 1 vertical in standing 2  reps of 1 minute and horizontal 1 rep of 1 minute. Patient demonstrating good technique. Patient reports that she has been able to progress to vert VOR X 1 in sitting for 1 minute at home. Discussed with patient that she can now add vertical with conflicting background and vertical VOR in standing to progression choices for HEP.   Stairs:  Patient worked on ascending/descending 6 steps and then 1 flight of steps multiple reps progressing from use of one hand on rail to no rails. Worked on adding progression of alternating cone tapping with UES and then added horizontal head turns while ascending/descending steps. Note all stairs activities were performed with sunglasses donned to replicate low light environment.   Airex Balance Beam: On Airex balance beam, performed sideways stance static holds with horizontal and vertical head turns.  On Airex balance beam, performed sideways stepping with horizontal head turns 5' times multiple reps. On Airex balance beam, performed sideways stepping with vertical head turns 5' times multiple reps. On Airex balance beam, performed tandem walking 5' times 4 reps with CGA. Patient with increased difficulty with tandem walking but improved with practice.  Note all Airex balance beam activities were performed with sunglasses donned to replicate low light environment.   foam roll: On 1/2 foam roll with flat side down and then round side down worked on static holds with and without vertical head turns for 2-4 minutes  each. Worked on 1/2 foam roll flat side down static holds while doing body turns side to side.   Worked on 1/2 foam roll with round side down static holds EO/EC for 1 minute reps.        PT Education - 08/31/16 0816    Education provided Yes   Person(s) Educated Patient   Methods Explanation   Comprehension Verbalized understanding             PT Long Term Goals - 08/24/16 0953      PT LONG TERM GOAL #1   Title  Pt will be independent  with HEP in order to improve strength and balance in order to decrease fall risk and improve function at home and work   Time 4   Period Weeks   Status Achieved     PT LONG TERM GOAL #2   Title Pt will report at least 50% reduction in symptoms in order to improve function at home and ability to travel/hike   Baseline patient rates 50-55% improvement in her symptoms since beginning therapy on 08/24/2016   Time 4   Period Weeks   Status Achieved     PT LONG TERM GOAL #3   Title  Pt will improve DGI by at least 3 points in order to demonstrate clinically significant improvement in balance and decreased risk for falls    Baseline 07/21/16: 19/24; scored on 08/24/2016 22/24   Time 4   Period Weeks   Status Achieved     PT LONG TERM GOAL #4   Title Patient will reduce perceived disability to low levels as indicated by <40 on Dizziness Handicap Inventory.   Baseline Patient scored 56/100 on 08/11/2016; scored on 08/24/2016 44/100    Time 4   Period Weeks   Status Partially Met     PT LONG TERM GOAL #5   Title Pt will report at least 75% reduction in symptoms in order to improve function at home and ability to travel/hike.   Baseline reported 50-55% on 08/24/2016   Time 6   Period Weeks   Status New     Additional Long Term Goals   Additional Long Term Goals Yes     PT LONG TERM GOAL #6   Title Patient will be able to descend/ascend stairs without lateral veering and increase in dizziness symptoms in order to safely be able to navigate stairs at her church and in the community.    Time 6   Period Weeks   Status New     PT LONG TERM GOAL #7   Title Patient will be able to ambulate with vertical head turns without significant lateral deviations and change in gait speed to improve community mobility safety.    Time 6   Period Weeks   Status New               Plan - 08/31/16 0816    Rehab Potential Fair   Clinical Impairments Affecting Rehab Potential Positive: motivation, age;  Negative: chronicity, vestibular migraines   PT Frequency 1x / week   PT Duration 6 weeks   PT Treatment/Interventions ADLs/Self Care Home Management;Canalith Repostioning;Cryotherapy;Electrical Stimulation;Iontophoresis 60m/ml Dexamethasone;Traction;Moist Heat;Ultrasound;DME Instruction;Gait training;Stair training;Functional mobility training;Therapeutic activities;Therapeutic exercise;Balance training;Neuromuscular re-education;Patient/family education;Manual techniques;Passive range of motion;Vestibular   PT Next Visit Plan 2 X 4 activties and repeat on Airex balance beam, Airex pad EO/EC activities, stairs with head turns as tolerated; consider optokinetic stripes or activities with guaze or sunglasses donned to recreate lowlight  environments-patient reporting difficulty driving in car when sunlight filters in through trees,etc and the light levels change   PT Home Exercise Plan VOR x 1 horizontal in standing with feet apart plain background 30 seconds x 3, 4 times/day, semitandem progression with horizontal and vertical head turns with horizontal head and body turns; VOR X 2 in sitting horizontal and vertical 1 minute reps; EC static holds 30 seconds on firm surface with feet together and semi-tandem progressions standing in corner for safetyi   Consulted and Agree with Plan of Care Patient      Patient will benefit from skilled therapeutic intervention in order to improve the following deficits and impairments:  Dizziness, Decreased balance  Visit Diagnosis: No diagnosis found.     Problem List There are no active problems to display for this patient.  Lady Deutscher PT, DPT Lady Deutscher 08/31/2016, 8:16 AM  [place] MAIN [facility] SERVICES [address] [place], [place], [address] Phone: [phone]   Fax:  [phone]  Name: Helen Trevino MRN: [id number] Date of Birth: [birth year]

## 2016-09-10 ENCOUNTER — Encounter: Payer: Self-pay | Admitting: Physical Therapy

## 2016-09-10 ENCOUNTER — Ambulatory Visit: Payer: BLUE CROSS/BLUE SHIELD | Admitting: Physical Therapy

## 2016-09-10 DIAGNOSIS — R42 Dizziness and giddiness: Secondary | ICD-10-CM | POA: Diagnosis not present

## 2016-09-10 DIAGNOSIS — R2681 Unsteadiness on feet: Secondary | ICD-10-CM

## 2016-09-10 NOTE — Therapy (Signed)
[place] [facility] MAIN [facility] SERVICES [address] [place], [place], [address] Phone: [phone]   Fax:  [phone]  Physical Therapy Treatment  Patient Details  Name: Helen Trevino MRN: [id number] Date of Birth: Jul 22, 1961 Referring Provider: Maurine Minister PA-C  Encounter Date: 09/10/2016      PT End of Session - 09/10/16 0907    Visit Number 7   Number of Visits 11   Date for PT Re-Evaluation 09/29/16   Authorization Type no go codes   PT Start Time 0904   PT Stop Time 0948   PT Time Calculation (min) 44 min   Equipment Utilized During Treatment Gait belt   Activity Tolerance Patient tolerated treatment well   Behavior During Therapy [facility] for tasks assessed/performed      History reviewed. No pertinent past medical history.  History reviewed. No pertinent surgical history.  There were no vitals filed for this visit.      Subjective Assessment - 09/10/16 0907    Subjective Patient states she has been practicing a lot especially those exercises that she finds to be the most difficult for her. Patient states she had "one bad headache this past week but other than that I have been good." Patient states the bad headache was dizziness. Patient states overall she feels she is doing much better iwth her dizziness now. Pt states she can now tolerate driving and seeing shadows and she has noticed lots of differences in day to day life.  Patient states she was able to go on a 9 mile hike this past week on uneven ground and up/down hills. Patient states that she took a rest break half way through the hike and that she had mild sensation of imbalance and dizziness at times but that overall she was able to tolerate this activity well.    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 She gets both dizziness and vertigo prior to, during, and following her migraines. Pt reports that she has been to ENT  twice and tested negative for positional vertigo. She has also had a VNG at ENT which was negative for peripheral disorders. She has also seen ENT at [facility] who ruled her out for Meniere's disease. Pt is working on a food diary currently to try to help in finding triggers related to her migraines. She knows that raw onions, bananas, and citrus bring on her migraines. Pt has never tried vestibular therapy before. She has a remote history of PT for back pain.      Limitations Walking   Diagnostic tests MRI: WNL, normal IAC   Patient Stated Goals Decrease dizziness and improve unsteadiness with walking   Currently in Pain? No/denies   Pain Onset More than a month ago     Neuromuscular Re-education:  Optokinetic Stripes:  Viewed horizontal optokinetic stripes left to right 3 minute video while standing with normal base of support on Airex balance beam. Patient with increased sway but no overt losses of balance during the 3 minutes. Patient reports that during video she felt she was swaying and felt mild symptoms but states she was surprised that she was able to tolerate the video and states she is not having nausea or headache symptoms.  After optokinetic stripes first rep worked on activities on Airex balance beam. At end of session attempted to perform second rep of optokinetic stripes but patient able to tolerate 45 seconds before reporting mild nausea sensation therefore  stopped.   On Airex balance beam: Patient performed sidestepping L/R with horizontal and then vertical head turns multiple reps times 5' each with CGA.  Performed on Airex balance beam tandem walking 5' times multiple reps. Patient without losses of balance on balance beam and did not need to reach to touch for support.  Performed feet together progressions progressing to feet together position while doing static sideways static standing on Airex balance beam with eyes closed with and without horizontal and vertical head turns for 30  second holds.    2" X 4" board:   On 2" X 4" board, worked on tandem walking 8' times multiple reps. Patient with one small loss of balance that patient was able to self-correct with CGA.         PT Education - 09/10/16 0907    Education provided Yes   Education Details discussed progression of EC on pillow with adding slow vert and horizontal head turns and watching You Tube Optokinetic stripes video clip for home exercise program.    Person(s) Educated Patient   Methods Explanation   Comprehension Verbalized understanding             PT Long Term Goals - 08/24/16 0953      PT LONG TERM GOAL #1   Title  Pt will be independent with HEP in order to improve strength and balance in order to decrease fall risk and improve function at home and work   Time 4   Period Weeks   Status Achieved     PT LONG TERM GOAL #2   Title Pt will report at least 50% reduction in symptoms in order to improve function at home and ability to travel/hike   Baseline patient rates 50-55% improvement in her symptoms since beginning therapy on 08/24/2016   Time 4   Period Weeks   Status Achieved     PT LONG TERM GOAL #3   Title  Pt will improve DGI by at least 3 points in order to demonstrate clinically significant improvement in balance and decreased risk for falls    Baseline 07/21/16: 19/24; scored on 08/24/2016 22/24   Time 4   Period Weeks   Status Achieved     PT LONG TERM GOAL #4   Title Patient will reduce perceived disability to low levels as indicated by <40 on Dizziness Handicap Inventory.   Baseline Patient scored 56/100 on 08/11/2016; scored on 08/24/2016 44/100    Time 4   Period Weeks   Status Partially Met     PT LONG TERM GOAL #5   Title Pt will report at least 75% reduction in symptoms in order to improve function at home and ability to travel/hike.   Baseline reported 50-55% on 08/24/2016   Time 6   Period Weeks   Status New     Additional Long Term Goals   Additional Long  Term Goals Yes     PT LONG TERM GOAL #6   Title Patient will be able to descend/ascend stairs without lateral veering and increase in dizziness symptoms in order to safely be able to navigate stairs at her church and in the community.    Time 6   Period Weeks   Status New     PT LONG TERM GOAL #7   Title Patient will be able to ambulate with vertical head turns without significant lateral deviations and change in gait speed to improve community mobility safety.    Time 6  Period Weeks   Status New               Plan - 09/10/16 0907    Clinical Impression Statement Patient demonstrates marked improvements with tandem walking and sideways static stance activities on Airex balance beam. Patient able to tolerate optokinetic stripes this date and added this to her HEP. Patient reporting significant improvements in her symptoms and notes that she is now able to tolerate and do activities that she has been avoiding for years due to concern that it would bring on her symptoms. Will plan on trying to do activities on the Balance Master system next visit.    Rehab Potential Fair   Clinical Impairments Affecting Rehab Potential Positive: motivation, age; Negative: chronicity, vestibular migraines   PT Frequency 1x / week   PT Duration 6 weeks   PT Treatment/Interventions ADLs/Self Care Home Management;Canalith Repostioning;Cryotherapy;Electrical Stimulation;Iontophoresis 2m/ml Dexamethasone;Traction;Moist Heat;Ultrasound;DME Instruction;Gait training;Stair training;Functional mobility training;Therapeutic activities;Therapeutic exercise;Balance training;Neuromuscular re-education;Patient/family education;Manual techniques;Passive range of motion;Vestibular   PT Next Visit Plan Optokinetic stripes possibly while doing slow walking on treadmill and try Balance Master with visual surround and surface sway-referenced activities as tolerated.    PT Home Exercise Plan VOR x 1 horizontal in standing  with feet apart plain background 30 seconds x 3, 4 times/day, semitandem progression with horizontal and vertical head turns with horizontal head and body turns; VOR X 2 in sitting horizontal and vertical 1 minute reps; EC static holds 30 seconds on firm surface with feet together and semi-tandem progressions standing in corner for safety; VOR X 1 vertical 1 minute in standing and conflicting background  progressions; added optokinetic stripes You Tube video clip and EC with slow head turns vert and horiz while standing on pillow standing in corner for safety   Consulted and Agree with Plan of Care Patient      Patient will benefit from skilled therapeutic intervention in order to improve the following deficits and impairments:  Dizziness, Decreased balance  Visit Diagnosis: Dizziness and giddiness  Unsteadiness on feet     Problem List There are no active problems to display for this patient.  DLady DeutscherPT, DPT MLady Deutscher1/26/2018, 10:10 AM  [place] [facility] [address] [place] [address]Phone: [phone]  Fax:  [phone] Name: Helen KLEEMRN: [id number] of Birth: [birth year]

## 2016-09-14 ENCOUNTER — Encounter: Payer: Self-pay | Admitting: Physical Therapy

## 2016-09-14 ENCOUNTER — Ambulatory Visit: Payer: BLUE CROSS/BLUE SHIELD | Admitting: Physical Therapy

## 2016-09-14 DIAGNOSIS — R42 Dizziness and giddiness: Secondary | ICD-10-CM | POA: Diagnosis not present

## 2016-09-14 DIAGNOSIS — R2681 Unsteadiness on feet: Secondary | ICD-10-CM

## 2016-09-14 NOTE — Therapy (Signed)
[place] MAIN [facility] SERVICES [address] [place], [place], [address] Phone: [phone]   Fax:  [phone]  Physical Therapy Treatment  Patient Details  Name: Helen Trevino MRN: [id number] Date of Birth: 1960/09/12 Referring Provider: Paulita Cradle PA-C  Encounter Date: 09/14/2016      PT End of Session - 09/14/16 1227    Visit Number 8   Number of Visits 11   Date for PT Re-Evaluation 09/29/16   Authorization Type no go codes   PT Start Time 1109/11/03   PT Stop Time 1152   PT Time Calculation (min) 41 min   Equipment Utilized During Treatment Gait belt   Activity Tolerance Patient tolerated treatment well   Behavior During Therapy [facility] for tasks assessed/performed      History reviewed. No pertinent past medical history.  History reviewed. No pertinent surgical history.  There were no vitals filed for this visit.      Subjective Assessment - 09/14/16 1115    Subjective Patient states that she had a bad day on Sunday and part of Monday.  Pt states she was not able to do her HEP for the last several days.   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 She gets both dizziness and vertigo prior to, during, and following her migraines. Pt reports that she has been to ENT twice and tested negative for positional vertigo. She has also had a VNG at ENT which was negative for peripheral disorders. She has also seen ENT at [facility] who ruled her out for Meniere's disease. Pt is working on a food diary currently to try to help in finding triggers related to her migraines. She knows that raw onions, bananas, and citrus bring on her migraines. Pt has never tried vestibular therapy before. She has a remote history of PT for back pain.      Limitations Walking   Diagnostic tests MRI: WNL, normal IAC   Patient Stated Goals Decrease dizziness and improve unsteadiness with walking   Pain Onset More than a  month ago      Neuromuscular Re-education:  Optokinetic Stripes:  Viewed horizontal optokinetic stripes left to right 2.5 minute video while ambulating at comfortable walking speed on treadmill. Patient initially holding on to treadmill bar with both hands but was able to progress down to no hands. Patient with hands in guarding position and noticed several small trunk sways that patient was able to self-correct. Patient reports low level of reproduction of her symptoms with ambulation on treadmill but did not rate on 0-10 scale.     Freight forwarder: Worked on 3 reps each of conditions 3, 4, 5 and 6 of the sensory organization test. Patient challenged by sway referenced support activities more so than with sway referenced surround. Patient reports 7/10 dizziness symptoms. Patient deferred further activities on the Balance Master machine this date due to her symptoms. Discussed testing results for each of the conditions performed.    Tandem walking: Patient performed tandem walking 10' times 2 reps. Patient with no overt losses of balance but holds arms in guard position away from body and about waist level.   Grapevine Walking: Patient performed left/right grapevine activity 10' and was able to perform without difficulty.  Airex pad: On Airex pad, patient performed feet together and semi-tandem progressions with alternating lead leg with EO/EC with and without horiz head turns. Patient demonstrates increased sway with all activities with  eyes closed.    Patient states she is not shaking like she used to with new activities and states her family has noticed this as well.           PT Long Term Goals - 08/24/16 0953      PT LONG TERM GOAL #1   Title  Pt will be independent with HEP in order to improve strength and balance in order to decrease fall risk and improve function at home and work   Time 4   Period Weeks   Status Achieved     PT LONG TERM GOAL #2   Title Pt  will report at least 50% reduction in symptoms in order to improve function at home and ability to travel/hike   Baseline patient rates 50-55% improvement in her symptoms since beginning therapy on 08/24/2016   Time 4   Period Weeks   Status Achieved     PT LONG TERM GOAL #3   Title  Pt will improve DGI by at least 3 points in order to demonstrate clinically significant improvement in balance and decreased risk for falls    Baseline 07/21/16: 19/24; scored on 08/24/2016 22/24   Time 4   Period Weeks   Status Achieved     PT LONG TERM GOAL #4   Title Patient will reduce perceived disability to low levels as indicated by <40 on Dizziness Handicap Inventory.   Baseline Patient scored 56/100 on 08/11/2016; scored on 08/24/2016 44/100    Time 4   Period Weeks   Status Partially Met     PT LONG TERM GOAL #5   Title Pt will report at least 75% reduction in symptoms in order to improve function at home and ability to travel/hike.   Baseline reported 50-55% on 08/24/2016   Time 6   Period Weeks   Status New     Additional Long Term Goals   Additional Long Term Goals Yes     PT LONG TERM GOAL #6   Title Patient will be able to descend/ascend stairs without lateral veering and increase in dizziness symptoms in order to safely be able to navigate stairs at her church and in the community.    Time 6   Period Weeks   Status New     PT LONG TERM GOAL #7   Title Patient will be able to ambulate with vertical head turns without significant lateral deviations and change in gait speed to improve community mobility safety.    Time 6   Period Weeks   Status New               Plan - 09/14/16 1227    Clinical Impression Statement Patient states she had another episode on Sunday which lasted into part of Monday and therefore was not able to perform her HEP over the weekend. Patient states she is not shaking like she used to with new activities and states her family has noticed this as well.   Patient states she is not shaking like she used to with new activities and states her family has noticed this as well. Patient states she is not shaking like she used to with new activities and states her family has noticed this as well.  Patient states she is not shaking like she used to with new activities and states her family has noticed this as well.  Patient states she is not shaking like she used to with new activities and states her family has noticed  this as well. Patient states she has noticed that she is not shaking like she used to with trying new activities and that her family has noticed this change as well. Patient was challenged by sway referenced surround and support surface activities on the Geophysicist/field seismologist machine this date. Patient would benefit from continued practice with NeuroCom activities. Patient able to progress to ambulating on a treadmill at comfortable speed while viewing optokinetic stripes this date but was only able to tolerate one 2 adn a half minute video. Will plan on continuing these activities next visit.     Rehab Potential Fair   Clinical Impairments Affecting Rehab Potential Positive: motivation, age; Negative: chronicity, vestibular migraines   PT Frequency 1x / week   PT Duration 6 weeks   PT Treatment/Interventions ADLs/Self Care Home Management;Canalith Repostioning;Cryotherapy;Electrical Stimulation;Iontophoresis '4mg'$ /ml Dexamethasone;Traction;Moist Heat;Ultrasound;DME Instruction;Gait training;Stair training;Functional mobility training;Therapeutic activities;Therapeutic exercise;Balance training;Neuromuscular re-education;Patient/family education;Manual techniques;Passive range of motion;Vestibular   PT Next Visit Plan Optokinetic stripes possibly while doing treadmill walking and Balance Master with visual surround and surface sway-referenced activities as tolerated.    PT Home Exercise Plan VOR x 1 horizontal in standing with feet apart plain background  30 seconds x 3, 4 times/day, semitandem progression with horizontal and vertical head turns with horizontal head and body turns; VOR X 2 in sitting horizontal and vertical 1 minute reps; EC static holds 30 seconds on firm surface with feet together and semi-tandem progressions standing in corner for safety; VOR X 1 vertical 1 minute in standing and conflicting background  progressions; added optokinetic stripes You Tube video clip and EC with slow head turns vert and horiz while standing on pillow standing in corner for safety   Consulted and Agree with Plan of Care Patient      Patient will benefit from skilled therapeutic intervention in order to improve the following deficits and impairments:  Dizziness, Decreased balance  Visit Diagnosis: Dizziness and giddiness  Unsteadiness on feet     Problem List There are no active problems to display for this patient.  Lady Deutscher PT, DPT Lady Deutscher 09/14/2016, 2:46 PM  [place] MAIN [facility] SERVICES [address] [place], [place], [address] Phone: [phone]   Fax:  [phone]  Name: Helen Trevino MRN: [id number] Date of Birth: 1960/11/14

## 2016-09-21 ENCOUNTER — Ambulatory Visit: Payer: BLUE CROSS/BLUE SHIELD | Attending: Physician Assistant | Admitting: Physical Therapy

## 2016-09-21 ENCOUNTER — Encounter: Payer: Self-pay | Admitting: Physical Therapy

## 2016-09-21 DIAGNOSIS — R42 Dizziness and giddiness: Secondary | ICD-10-CM | POA: Diagnosis not present

## 2016-09-21 DIAGNOSIS — R2681 Unsteadiness on feet: Secondary | ICD-10-CM | POA: Insufficient documentation

## 2016-09-21 NOTE — Therapy (Signed)
[place] MAIN [facility] SERVICES [address] [place], [place], [address] Phone: [phone]   Fax:  [phone]  Physical Therapy Treatment  Patient Details  Name: Helen Trevino MRN: [id number] Date of Birth: Nov 19, 1960 Referring Provider: Paulita Cradle PA-C  Encounter Date: 09/21/2016      PT End of Session - 09/21/16 1014    Visit Number 9   Number of Visits 11   Date for PT Re-Evaluation 09/29/16   Authorization Type no go codes   PT Start Time 1015   PT Stop Time 1059   PT Time Calculation (min) 44 min   Equipment Utilized During Treatment Gait belt   Activity Tolerance Patient tolerated treatment well   Behavior During Therapy [facility] for tasks assessed/performed      History reviewed. No pertinent past medical history.  History reviewed. No pertinent surgical history.  There were no vitals filed for this visit.      Subjective Assessment - 09/21/16 1014    Subjective Patient states she was tired after the last visit but did not have an episode of vestibular migraine. Patient states she was happy because she felt better. Patient states she is extremely happy wit the progress she has made since initial evaluation. Patient states she has been practicing the optokinetic stripes exercise at home and states it is starting to get easier. Patient states she had a terrible headache on Sunday. Patient states she took medicine and pushed through and was able to do the activities she wanted to do that day. Patient states Monday she was tired, but she was able to go to a strength and conditioning class. Patient states they worked a lot on balance activities. Patient states she liked the class and wants to continue to go.   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 She gets both dizziness and vertigo prior to, during, and following her migraines. Pt reports that she has been to ENT twice and  tested negative for positional vertigo. She has also had a VNG at ENT which was negative for peripheral disorders. She has also seen ENT at [facility] who ruled her out for Meniere's disease. Pt is working on a food diary currently to try to help in finding triggers related to her migraines. She knows that raw onions, bananas, and citrus bring on her migraines. Pt has never tried vestibular therapy before. She has a remote history of PT for back pain.      Limitations Walking   Diagnostic tests MRI: WNL, normal IAC   Patient Stated Goals Decrease dizziness and improve unsteadiness with walking   Currently in Pain? --  sore from exercising   Pain Onset More than a month ago      Freight forwarder:  Worked on multiple reps of conditions 2,3,5 and 6 of the sensory organization test. Patient challenged by sway referenced surround & support activities. Patient demonstrates increased sway with eyes closed activities but is able to regain balance without external support or use of UEs.  Performed rhythmic weight shifts side to side and anterior/posterior at 1 and 2 second intervals multiple reps.  Patient reports the rhythmic sway activity was difficult to watch the target moving back and forth as this was beginning to bring on symptoms and patient was concerned that if she continued it might trigger a headache.   Optokinetic Stripes:  Viewed vertical optokinetic stripes right to left 3 minute video while  walking at comfortable speed on treadmill. Patient took short standing rest break and then watched optokinetic stripes left to right for 1 1/2 minutes before patient needed to stop secondary to patient states she started to get an earache which she states can be a sign of a headache beginning. After rest break, patient reports that her symptoms subsided.         PT Education - 09/21/16 1014    Education provided Yes   Education Details discussed plan of care and plans for discharge;  discussed continuing to do HEP upon discharge and discussed community exercise/balance classes.    Person(s) Educated Patient   Methods Explanation   Comprehension Verbalized understanding             PT Long Term Goals - 08/24/16 0953      PT LONG TERM GOAL #1   Title  Pt will be independent with HEP in order to improve strength and balance in order to decrease fall risk and improve function at home and work   Time 4   Period Weeks   Status Achieved     PT LONG TERM GOAL #2   Title Pt will report at least 50% reduction in symptoms in order to improve function at home and ability to travel/hike   Baseline patient rates 50-55% improvement in her symptoms since beginning therapy on 08/24/2016   Time 4   Period Weeks   Status Achieved     PT LONG TERM GOAL #3   Title  Pt will improve DGI by at least 3 points in order to demonstrate clinically significant improvement in balance and decreased risk for falls    Baseline 07/21/16: 19/24; scored on 08/24/2016 22/24   Time 4   Period Weeks   Status Achieved     PT LONG TERM GOAL #4   Title Patient will reduce perceived disability to low levels as indicated by <40 on Dizziness Handicap Inventory.   Baseline Patient scored 56/100 on 08/11/2016; scored on 08/24/2016 44/100    Time 4   Period Weeks   Status Partially Met     PT LONG TERM GOAL #5   Title Pt will report at least 75% reduction in symptoms in order to improve function at home and ability to travel/hike.   Baseline reported 50-55% on 08/24/2016   Time 6   Period Weeks   Status New     Additional Long Term Goals   Additional Long Term Goals Yes     PT LONG TERM GOAL #6   Title Patient will be able to descend/ascend stairs without lateral veering and increase in dizziness symptoms in order to safely be able to navigate stairs at her church and in the community.    Time 6   Period Weeks   Status New     PT LONG TERM GOAL #7   Title Patient will be able to ambulate with  vertical head turns without significant lateral deviations and change in gait speed to improve community mobility safety.    Time 6   Period Weeks   Status New               Plan - 09/21/16 1014    Clinical Impression Statement Patient did well with NeuroCom and treadmill walking while viewing optokinetic stripes activities this date and she was able to tolerate longer time intervals for practice for both of these exercises. Patient reporting significant improvement in her symptoms overall since initiating therapy. Will plan  on repeating functional outcome measures and preparing for discharge in the next one to two PT sessions.    Rehab Potential Fair   Clinical Impairments Affecting Rehab Potential Positive: motivation, age; Negative: chronicity, vestibular migraines   PT Frequency 1x / week   PT Duration 6 weeks   PT Treatment/Interventions ADLs/Self Care Home Management;Canalith Repostioning;Cryotherapy;Electrical Stimulation;Iontophoresis 60m/ml Dexamethasone;Traction;Moist Heat;Ultrasound;DME Instruction;Gait training;Stair training;Functional mobility training;Therapeutic activities;Therapeutic exercise;Balance training;Neuromuscular re-education;Patient/family education;Manual techniques;Passive range of motion;Vestibular   PT Next Visit Plan repeat functional outcome measures and discharge in next 1-2 sessions    PT Home Exercise Plan VOR x 1 horizontal in standing with feet apart plain background 30 seconds x 3, 4 times/day, semitandem progression with horizontal and vertical head turns with horizontal head and body turns; VOR X 2 in sitting horizontal and vertical 1 minute reps; EC static holds 30 seconds on firm surface with feet together and semi-tandem progressions standing in corner for safety; VOR X 1 vertical 1 minute in standing and conflicting background  progressions; added optokinetic stripes You Tube video clip and EC with slow head turns vert and horiz while standing on  pillow standing in corner for safety   Consulted and Agree with Plan of Care Patient      Patient will benefit from skilled therapeutic intervention in order to improve the following deficits and impairments:  Dizziness, Decreased balance  Visit Diagnosis: Dizziness and giddiness  Unsteadiness on feet     Problem List There are no active problems to display for this patient.  DLady DeutscherPT, DPT MLady Deutscher2/01/2017, 3:39 PM  [place] [facility] [address] [address]Phone: [phone]  Fax:  [phone] Name: Helen SABREEMRN: [id number] of Birth: [birth year]

## 2016-10-01 ENCOUNTER — Encounter: Payer: Self-pay | Admitting: Physical Therapy

## 2016-10-01 ENCOUNTER — Ambulatory Visit: Payer: BLUE CROSS/BLUE SHIELD | Admitting: Physical Therapy

## 2016-10-01 DIAGNOSIS — R42 Dizziness and giddiness: Secondary | ICD-10-CM

## 2016-10-01 DIAGNOSIS — R2681 Unsteadiness on feet: Secondary | ICD-10-CM

## 2016-10-01 NOTE — Therapy (Addendum)
[place] MAIN [facility] SERVICES [address] [place], [place], [address] Phone: [phone]   Fax:  [phone]  Physical Therapy Treatment/Discharge Summary  Patient Details  Name: Helen Trevino MRN: [id number] Date of Birth: 02-Jul-1961 Referring Provider: Paulita Cradle PA-C  Encounter Date: 10/01/2016      PT End of Session - 10/01/16 0814    Visit Number 10   Number of Visits 11   Date for PT Re-Evaluation 09/29/16   Authorization Type no go codes   PT Start Time 0806   PT Stop Time 0853   PT Time Calculation (min) 47 min   Activity Tolerance Patient tolerated treatment well   Behavior During Therapy [facility] for tasks assessed/performed      History reviewed. No pertinent past medical history.  History reviewed. No pertinent surgical history.  There were no vitals filed for this visit.      Subjective Assessment - 10/01/16 0812    Subjective Patient states she got a new prescription for her glasses and she has been wearing them for short periods of time to acclimate. Patient states her symptoms have been a little worse this week but she states this might  be due to weather and new glasses. Patient states she also did a sculpting and pilates class this past week. Patient states she is getting out and doing more. Patient states she is very pleased with the amount of progress she has made with therapy.     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 She gets both dizziness and vertigo prior to, during, and following her migraines. Pt reports that she has been to ENT twice and tested negative for positional vertigo. She has also had a VNG at ENT which was negative for peripheral disorders. She has also seen ENT at [facility] who ruled her out for Meniere's disease. Pt is working on a food diary currently to try to help in finding triggers related to her migraines. She knows that raw onions,  bananas, and citrus bring on her migraines. Pt has never tried vestibular therapy before. She has a remote history of PT for back pain.      Limitations Walking   Diagnostic tests MRI: WNL, normal IAC   Patient Stated Goals Decrease dizziness and improve unsteadiness with walking   Currently in Pain? Other (Comment)  none stated   Pain Onset More than a month ago      Neuromuscular Re-education:  Patient completed [place] (no charge) and performed DGI test. FUNCTIONAL OUTCOME MEASURES:  Results Comments  DHI 44/100 Moderate perception of handicap  DGI 23/24 Normal; safe for community mobility    Discussed functional outcome measure test results and compared to prior testing. Discussed progress towards goals and reviewed with patient. Discussed community based balance and exercise programs and patient reports that she plans on joining the gym and continuing to perform her HEP as well as attend exercise classes upon discharge.   Patient performed ambulation with vertical head turns multiple trials.  Patient performed ambulating up/down 1 flight of stairs, 4 steps and 6 steps without holding rail. Patient reports no increase in dizziness and no veering or lateral deviations noted but patient did touch rail with elbow or hand for a few seconds occasionally.        [facility] PT Assessment - 10/01/16 1002      Dynamic Gait Index   Level Surface Normal   Change in Gait  Speed Normal   Gait with Horizontal Head Turns Normal   Gait with Vertical Head Turns Normal   Gait and Pivot Turn Normal   Step Over Obstacle Normal   Step Around Obstacles Normal   Steps Mild Impairment   Total Score 23     Habituation: Patient reports that she had a difficult time with performing head roll/relaxation movement during exercise class at her gym. Discussed habituation exercises and demonstrated. Patient performed in sitting about 5 reps of head rolls. Patient reports that she feels this movement brings on her  symptoms. Patient drove herself to clinic this date so did not perform additional reps in clinic. Patient to continue to work on progressing this at home.        PT Education - 10/01/16 [phone]    Education provided Yes   Education Details Discussed functional outcome measures and progress towards goals. Printed out second copy of HEP and reviewed. Discussed habituation exercises. Discussed discharge plans.   Person(s) Educated Patient   Methods Explanation;Handout   Comprehension Verbalized understanding;Returned demonstration             PT Long Term Goals - 10/01/16 0814      PT LONG TERM GOAL #1   Title  Pt will be independent with HEP in order to improve strength and balance in order to decrease fall risk and improve function at home and work   Time 4   Period Weeks   Status Achieved     PT LONG TERM GOAL #2   Title Pt will report at least 50% reduction in symptoms in order to improve function at home and ability to travel/hike   Baseline patient rates 50-55% improvement in her symptoms since beginning therapy on 08/24/2016   Time 4   Period Weeks   Status Achieved     PT LONG TERM GOAL #3   Title  Pt will improve DGI by at least 3 points in order to demonstrate clinically significant improvement in balance and decreased risk for falls    Baseline 07/21/16: 19/24; scored on 08/24/2016 22/24; scored 23/24 on 10/01/2016   Time 4   Period Weeks   Status Achieved     PT LONG TERM GOAL #4   Title Patient will reduce perceived disability to low levels as indicated by <40 on Dizziness Handicap Inventory.   Baseline Patient scored 56/100 on 08/11/2016; scored on 08/24/2016 44/100; scored 44/100 on 10/01/2016   Time 4   Period Weeks   Status Partially Met     PT LONG TERM GOAL #5   Title Pt will report at least 75% reduction in symptoms in order to improve function at home and ability to travel/hike.   Baseline reported 50-55% on 08/24/2016; rates 80%   Time 6   Period Weeks    Status Achieved     PT LONG TERM GOAL #6   Title Patient will be able to descend/ascend stairs without lateral veering and increase in dizziness symptoms in order to safely be able to navigate stairs at her church and in the community.    Time 6   Period Weeks   Status Achieved     PT LONG TERM GOAL #7   Title Patient will be able to ambulate with vertical head turns without significant lateral deviations and change in gait speed to improve community mobility safety.    Time 6   Period Weeks   Status Achieved  Plan - 10/01/16 0814    Clinical Impression Statement Patient achieved 6/7 goals and partially met remaining goal as set on plan of care. Patient reporting 80% improvement in her overall symptoms since beginning vestibular therapy. Patient reports that she is doing activities that she has not been able to do and is even planning on taking a big trip in May. Patient improved from 19/24 to 23/24 on the DGI indicating decreased falls risk. Patient is independent with HEP and plans on continuing to perform HEP as well as patient is joining a gym and plans to do balance/exercise classes weekly. Patient in agreement with discharge from PT services at this time.    Rehab Potential Fair   Clinical Impairments Affecting Rehab Potential Positive: motivation, age; Negative: chronicity, vestibular migraines   PT Frequency 1x / week   PT Duration 6 weeks   PT Treatment/Interventions ADLs/Self Care Home Management;Canalith Repostioning;Cryotherapy;Electrical Stimulation;Iontophoresis '4mg'$ /ml Dexamethasone;Traction;Moist Heat;Ultrasound;DME Instruction;Gait training;Stair training;Functional mobility training;Therapeutic activities;Therapeutic exercise;Balance training;Neuromuscular re-education;Patient/family education;Manual techniques;Passive range of motion;Vestibular   PT Next Visit Plan --   PT Home Exercise Plan VOR x 1 horizontal in standing with feet apart plain background  30 seconds x 3, 4 times/day, semitandem progression with horizontal and vertical head turns with horizontal head and body turns; VOR X 2 in sitting horizontal and vertical 1 minute reps; EC static holds 30 seconds on firm surface with feet together and semi-tandem progressions standing in corner for safety; VOR X 1 vertical 1 minute in standing and conflicting background  progressions; added optokinetic stripes You Tube video clip and EC with slow head turns vert and horiz while standing on pillow standing in corner for safety   Consulted and Agree with Plan of Care Patient      Patient will benefit from skilled therapeutic intervention in order to improve the following deficits and impairments:  Dizziness, Decreased balance  Visit Diagnosis: Dizziness and giddiness - Plan: PT plan of care cert/re-cert  Unsteadiness on feet - Plan: PT plan of care cert/re-cert     Problem List There are no active problems to display for this patient.  Lady Deutscher PT, DPT Lady Deutscher 10/01/2016, 10:02 AM  [place] MAIN [facility] SERVICES [address] [place], [place], [address] Phone: [phone]   Fax:  [phone]  Name: JOLEENE BURNHAM MRN: [id number] Date of Birth: [birth year]

## 2016-10-08 ENCOUNTER — Encounter: Payer: BLUE CROSS/BLUE SHIELD | Admitting: Physical Therapy

## 2017-07-19 ENCOUNTER — Other Ambulatory Visit: Payer: Self-pay | Admitting: Physician Assistant

## 2017-07-19 DIAGNOSIS — Z1231 Encounter for screening mammogram for malignant neoplasm of breast: Secondary | ICD-10-CM

## 2017-08-17 ENCOUNTER — Ambulatory Visit
Admission: RE | Admit: 2017-08-17 | Discharge: 2017-08-17 | Disposition: A | Discharge status: Home / Self Care | Payer: BLUE CROSS/BLUE SHIELD | Source: Ambulatory Visit | Attending: Physician Assistant | Admitting: Physician Assistant

## 2017-08-17 DIAGNOSIS — Z1231 Encounter for screening mammogram for malignant neoplasm of breast: Secondary | ICD-10-CM | POA: Diagnosis not present

## 2017-12-12 ENCOUNTER — Other Ambulatory Visit: Payer: Self-pay | Admitting: Physician Assistant

## 2017-12-12 DIAGNOSIS — R1011 Right upper quadrant pain: Secondary | ICD-10-CM

## 2017-12-12 DIAGNOSIS — R140 Abdominal distension (gaseous): Secondary | ICD-10-CM

## 2017-12-12 DIAGNOSIS — K30 Functional dyspepsia: Secondary | ICD-10-CM

## 2017-12-16 ENCOUNTER — Ambulatory Visit
Admission: RE | Admit: 2017-12-16 | Discharge: 2017-12-16 | Disposition: A | Discharge status: Home / Self Care | Payer: BLUE CROSS/BLUE SHIELD | Source: Ambulatory Visit | Attending: Physician Assistant | Admitting: Physician Assistant

## 2017-12-16 DIAGNOSIS — R140 Abdominal distension (gaseous): Secondary | ICD-10-CM

## 2017-12-16 DIAGNOSIS — R1011 Right upper quadrant pain: Secondary | ICD-10-CM | POA: Insufficient documentation

## 2017-12-16 DIAGNOSIS — K30 Functional dyspepsia: Secondary | ICD-10-CM | POA: Insufficient documentation

## 2017-12-20 ENCOUNTER — Other Ambulatory Visit: Payer: Self-pay | Admitting: Physician Assistant

## 2017-12-20 DIAGNOSIS — R140 Abdominal distension (gaseous): Secondary | ICD-10-CM

## 2017-12-20 DIAGNOSIS — K30 Functional dyspepsia: Secondary | ICD-10-CM

## 2017-12-20 DIAGNOSIS — R1011 Right upper quadrant pain: Secondary | ICD-10-CM

## 2017-12-28 ENCOUNTER — Encounter: Payer: Self-pay | Admitting: Radiology

## 2017-12-28 ENCOUNTER — Encounter
Admission: RE | Admit: 2017-12-28 | Discharge: 2017-12-28 | Disposition: A | Discharge status: Home / Self Care | Payer: BLUE CROSS/BLUE SHIELD | Source: Ambulatory Visit | Attending: Physician Assistant | Admitting: Physician Assistant

## 2017-12-28 DIAGNOSIS — R140 Abdominal distension (gaseous): Secondary | ICD-10-CM | POA: Insufficient documentation

## 2017-12-28 DIAGNOSIS — K30 Functional dyspepsia: Secondary | ICD-10-CM

## 2017-12-28 DIAGNOSIS — R1011 Right upper quadrant pain: Secondary | ICD-10-CM | POA: Diagnosis present

## 2017-12-28 MED ORDER — TECHNETIUM TC 99M MEBROFENIN IV KIT
5.3730 | PACK | Freq: Once | INTRAVENOUS | Status: AC | PRN
  Administered 2017-12-28: 5.373 via INTRAVENOUS

## 2018-05-05 IMAGING — US US ABDOMEN LIMITED
1 series · 14 of 25 positions shown · non-contrast
Comparison: May 01, 2012

CLINICAL DATA: Right upper quadrant pain

EXAM:
ULTRASOUND ABDOMEN LIMITED RIGHT UPPER QUADRANT

[Series 1: us abdomen limited · 0.19mm/px · 14 of 45 slices shown]
[im 1/45]
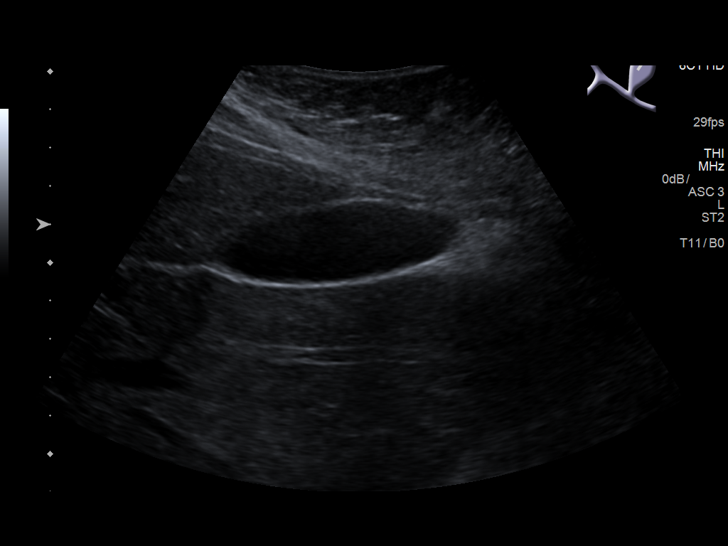
[im 4/45]
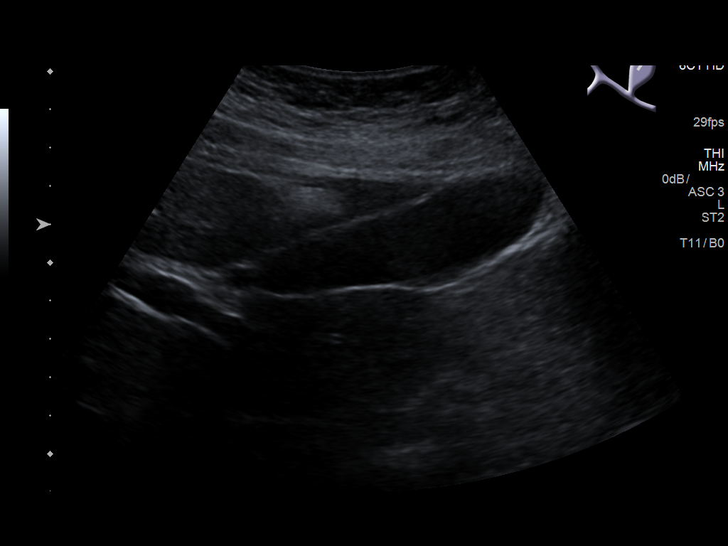
[im 8/45]
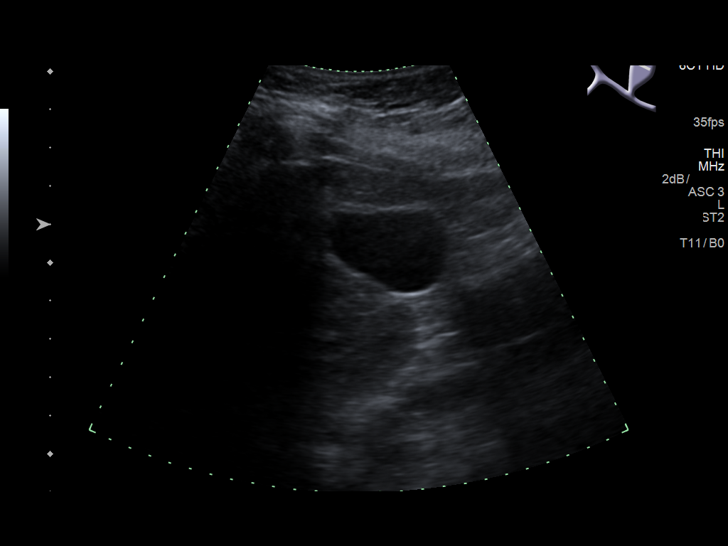
[im 12/45]
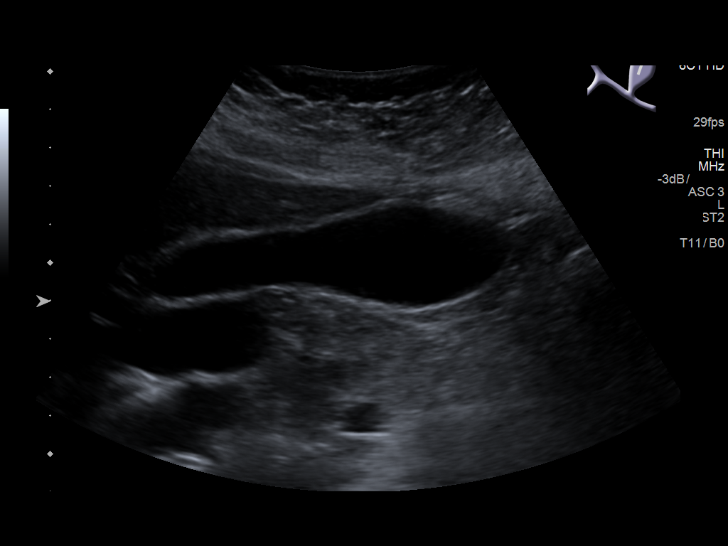
[im 15/45]
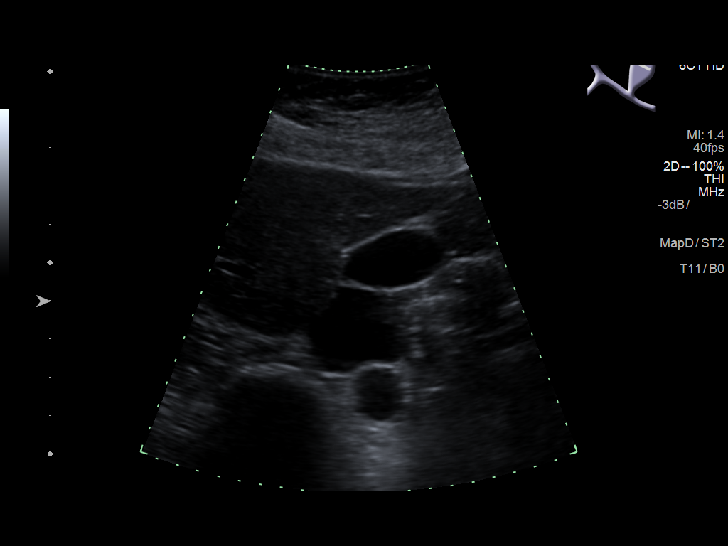
[im 17/45]
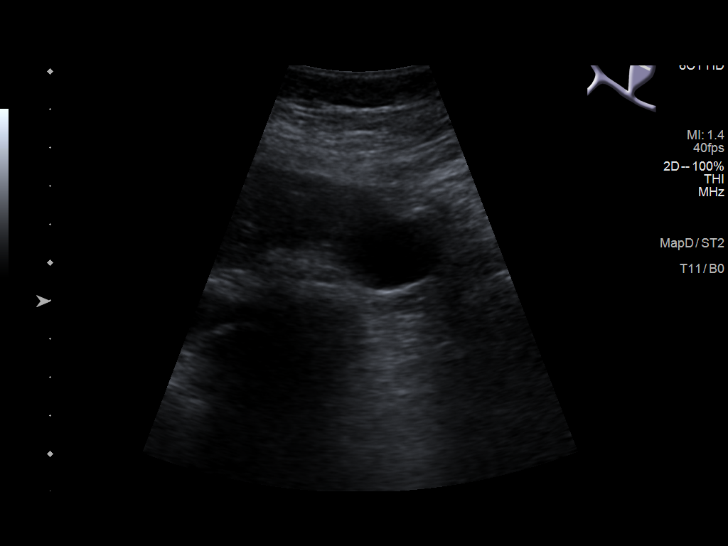
[im 21/45]
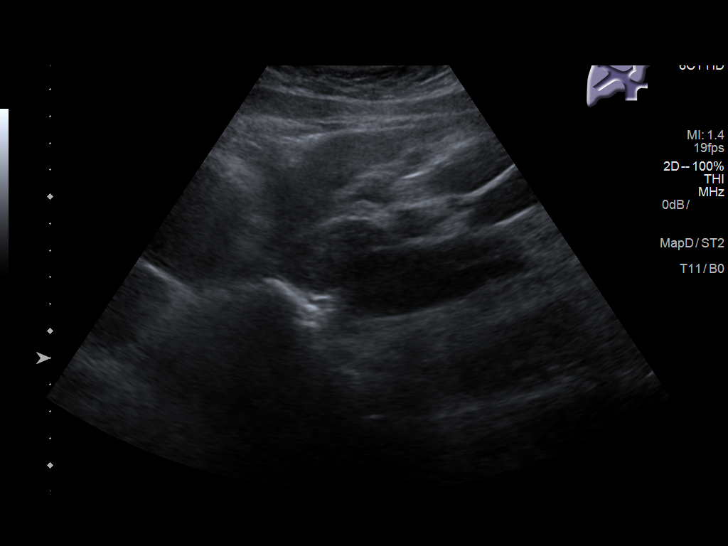
[im 24/45]
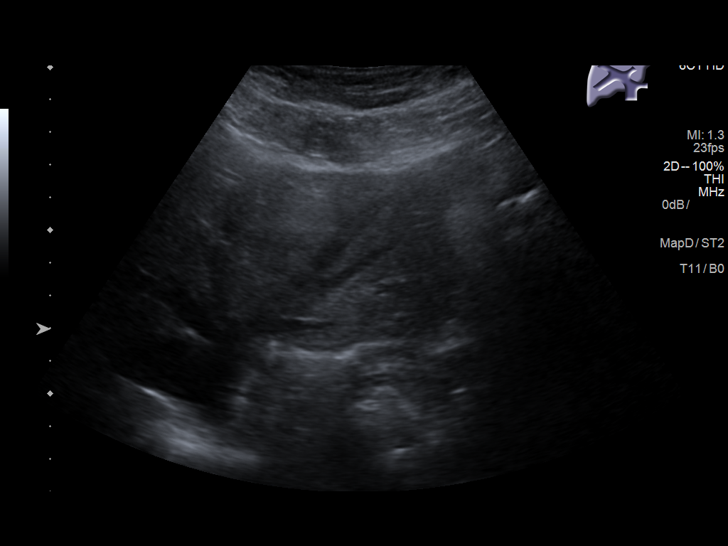
[im 28/45]
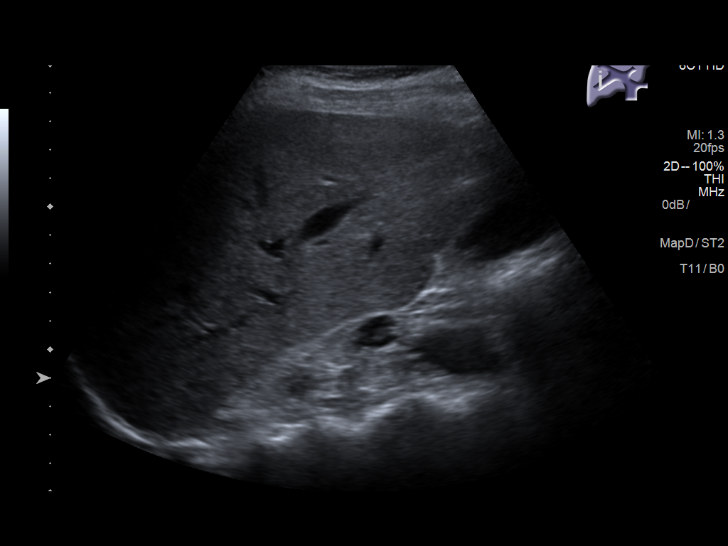
[im 30/45]
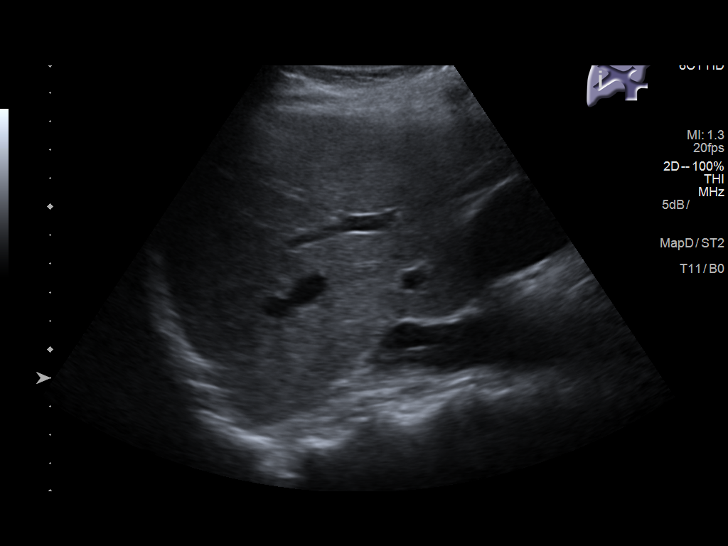
[im 34/45]
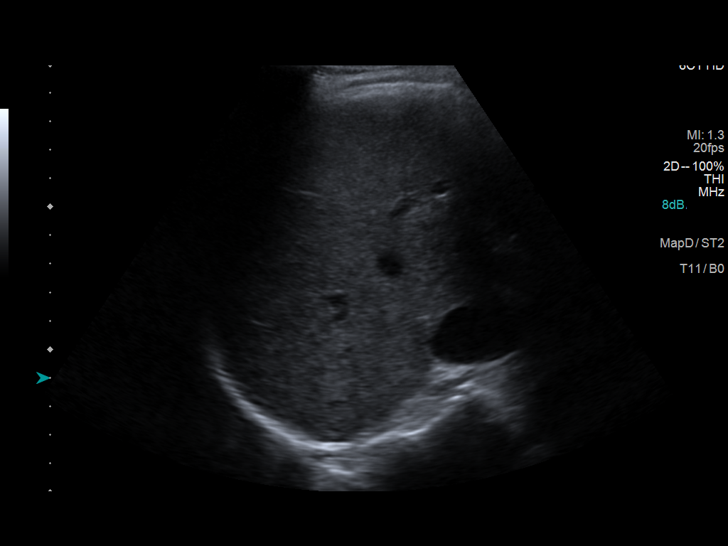
[im 37/45]
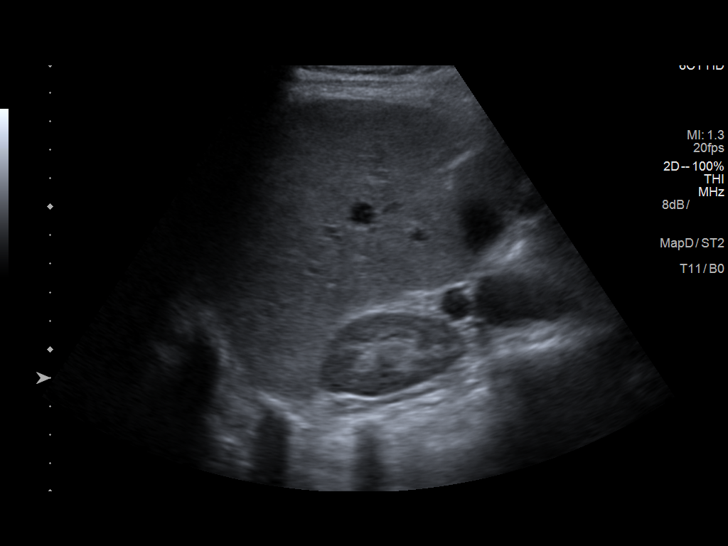
[im 41/45]
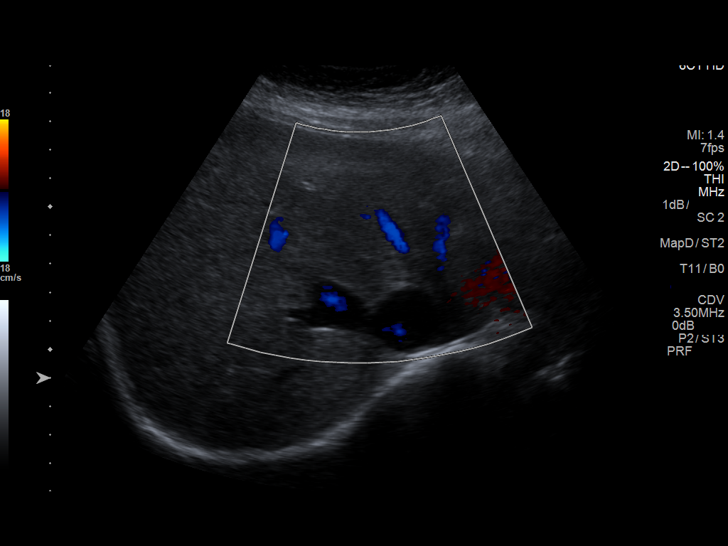
[im 45/45]
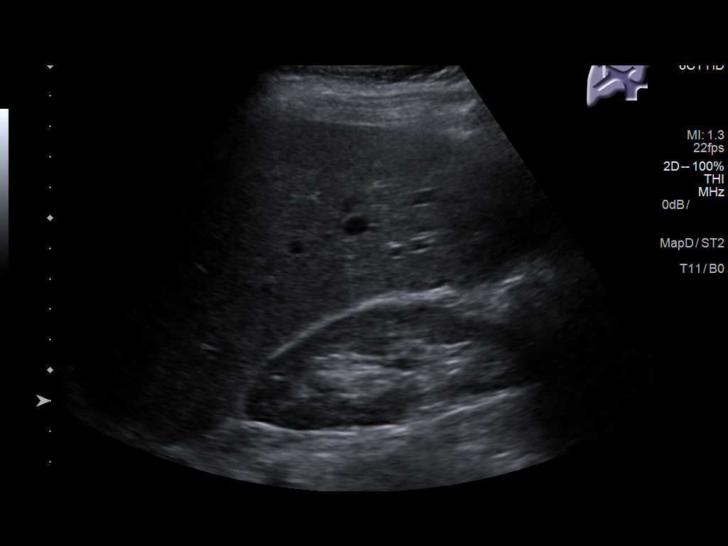

[14 of 25 positions shown; findings below may reference images not displayed]

FINDINGS: Gallbladder:

No gallstones or wall thickening visualized. There is no
pericholecystic fluid. No sonographic Murphy sign noted by
sonographer.

Common bile duct:

Diameter: 4 mm. No intrahepatic or extrahepatic biliary duct
dilatation.

Liver:

No focal lesion identified. Within normal limits in parenchymal
echogenicity. Portal vein is patent on color Doppler imaging with
normal direction of blood flow towards the liver.
IMPRESSION: Study within normal limits.

## 2018-07-18 ENCOUNTER — Other Ambulatory Visit: Payer: Self-pay | Admitting: Physician Assistant

## 2018-07-18 DIAGNOSIS — Z1231 Encounter for screening mammogram for malignant neoplasm of breast: Secondary | ICD-10-CM

## 2018-08-24 ENCOUNTER — Ambulatory Visit
Admission: RE | Admit: 2018-08-24 | Discharge: 2018-08-24 | Disposition: A | Discharge status: Home / Self Care | Payer: BLUE CROSS/BLUE SHIELD | Source: Ambulatory Visit | Attending: Physician Assistant | Admitting: Physician Assistant

## 2018-08-24 DIAGNOSIS — Z1231 Encounter for screening mammogram for malignant neoplasm of breast: Secondary | ICD-10-CM | POA: Diagnosis not present

## 2019-07-26 ENCOUNTER — Other Ambulatory Visit: Payer: Self-pay | Admitting: Physician Assistant

## 2019-07-26 DIAGNOSIS — Z1231 Encounter for screening mammogram for malignant neoplasm of breast: Secondary | ICD-10-CM

## 2019-09-03 ENCOUNTER — Ambulatory Visit
Admission: RE | Admit: 2019-09-03 | Discharge: 2019-09-03 | Disposition: A | Discharge status: Home / Self Care | Payer: BC Managed Care – PPO | Source: Ambulatory Visit | Attending: Physician Assistant | Admitting: Physician Assistant

## 2019-09-03 DIAGNOSIS — Z1231 Encounter for screening mammogram for malignant neoplasm of breast: Secondary | ICD-10-CM | POA: Insufficient documentation

## 2020-02-25 ENCOUNTER — Ambulatory Visit (INDEPENDENT_AMBULATORY_CARE_PROVIDER_SITE_OTHER): Payer: Self-pay

## 2020-02-25 ENCOUNTER — Other Ambulatory Visit: Payer: Self-pay

## 2020-02-25 DIAGNOSIS — L988 Other specified disorders of the skin and subcutaneous tissue: Secondary | ICD-10-CM

## 2020-02-25 NOTE — Progress Notes (Signed)
Pt presents today with excessive dry skin on her face and neck. She is currently using the Alastin products and hasn't added anything including medications. I preformed Microdermabrasion and appliefd Epionce Medical Barrier Cream. I recommended scheduling an appointment with Dr. Nehemiah Massed if symptoms persist or worsen.

## 2020-03-31 ENCOUNTER — Ambulatory Visit (INDEPENDENT_AMBULATORY_CARE_PROVIDER_SITE_OTHER): Payer: Self-pay

## 2020-03-31 ENCOUNTER — Other Ambulatory Visit: Payer: Self-pay

## 2020-03-31 DIAGNOSIS — Z872 Personal history of diseases of the skin and subcutaneous tissue: Secondary | ICD-10-CM

## 2020-03-31 DIAGNOSIS — L988 Other specified disorders of the skin and subcutaneous tissue: Secondary | ICD-10-CM

## 2020-04-14 ENCOUNTER — Ambulatory Visit: Payer: BC Managed Care – PPO

## 2020-06-09 ENCOUNTER — Ambulatory Visit: Payer: BC Managed Care – PPO | Admitting: Dermatology

## 2020-07-16 ENCOUNTER — Ambulatory Visit (INDEPENDENT_AMBULATORY_CARE_PROVIDER_SITE_OTHER): Payer: BC Managed Care – PPO | Admitting: Dermatology

## 2020-07-16 ENCOUNTER — Other Ambulatory Visit: Payer: Self-pay

## 2020-07-16 DIAGNOSIS — L821 Other seborrheic keratosis: Secondary | ICD-10-CM

## 2020-07-16 DIAGNOSIS — D1800 Hemangioma unspecified site: Secondary | ICD-10-CM

## 2020-07-16 DIAGNOSIS — L853 Xerosis cutis: Secondary | ICD-10-CM | POA: Diagnosis not present

## 2020-07-16 DIAGNOSIS — L84 Corns and callosities: Secondary | ICD-10-CM | POA: Diagnosis not present

## 2020-07-16 DIAGNOSIS — Z1283 Encounter for screening for malignant neoplasm of skin: Secondary | ICD-10-CM

## 2020-07-16 DIAGNOSIS — L818 Other specified disorders of pigmentation: Secondary | ICD-10-CM

## 2020-07-16 DIAGNOSIS — L814 Other melanin hyperpigmentation: Secondary | ICD-10-CM

## 2020-07-16 DIAGNOSIS — D229 Melanocytic nevi, unspecified: Secondary | ICD-10-CM

## 2020-07-16 DIAGNOSIS — L578 Other skin changes due to chronic exposure to nonionizing radiation: Secondary | ICD-10-CM

## 2020-07-16 NOTE — Patient Instructions (Addendum)
Cerave cream to face at bedtime  Recommend otc Salicylic acid plasters Discussed silicone toe protectors/separators

## 2020-07-16 NOTE — Progress Notes (Signed)
   Follow-Up Visit   Subjective  Helen Trevino is a 59 y.o. female who presents for the following: Annual Exam (Total body ski8n exam), xerosis (face, 29m, pt is using alastin products, and eminence facial cream), and growth (R 2nd toe, just noticed, sore). The patient presents for Total-Body Skin Exam (TBSE) for skin cancer screening and mole check.  The following portions of the chart were reviewed this encounter and updated as appropriate:  Tobacco  Allergies  Meds  Problems  Med Hx  Surg Hx  Fam Hx     Review of Systems:  No other skin or systemic complaints except as noted in HPI or Assessment and Plan.  Objective  Well appearing patient in no apparent distress; mood and affect are within normal limits.  A full examination was performed including scalp, head, eyes, ears, nose, lips, neck, chest, axillae, abdomen, back, buttocks, bilateral upper extremities, bilateral lower extremities, hands, feet, fingers, toes, fingernails, and toenails. All findings within normal limits unless otherwise noted below.  Objective  face: Xerosis face  Objective  bil legs: Hypopigmented macules  Objective  R medial 2nd toe: hyperkeratosis   Assessment & Plan    Lentigines - Scattered tan macules - Discussed due to sun exposure - Benign, observe - Call for any changes  Seborrheic Keratoses - Stuck-on, waxy, tan-brown papules and plaques  - Discussed benign etiology and prognosis. - Observe - Call for any changes  Melanocytic Nevi - Tan-brown and/or pink-flesh-colored symmetric macules and papules - Benign appearing on exam today - Observation - Call clinic for new or changing moles - Recommend daily use of broad spectrum spf 30+ sunscreen to sun-exposed areas.   Hemangiomas - Red papules - Discussed benign nature - Observe - Call for any changes  Actinic Damage - Chronic, secondary to cumulative UV/sun exposure - diffuse scaly erythematous macules with underlying  dyspigmentation - Recommend daily broad spectrum sunscreen SPF 30+ to sun-exposed areas, reapply every 2 hours as needed.  - Call for new or changing lesions.  Skin cancer screening performed today.  Xerosis cutis face  Start Cerave Cream qhs  Idiopathic guttate hypomelanosis bil legs  Benign, Observe  Callus R medial 2nd toe  Benign Recommend otc Salicylic acid plasters Discussed silicone toe protectors/separators   Skin cancer screening  Return in about 1 year (around 07/16/2021) for TBSE.   I, Othelia Pulling, RMA, am acting as scribe for Sarina Ser, MD .  Documentation: I have reviewed the above documentation for accuracy and completeness, and I agree with the above.  Sarina Ser, MD

## 2020-07-22 ENCOUNTER — Encounter: Payer: Self-pay | Admitting: Dermatology

## 2020-07-28 ENCOUNTER — Ambulatory Visit (INDEPENDENT_AMBULATORY_CARE_PROVIDER_SITE_OTHER): Payer: Self-pay

## 2020-07-28 ENCOUNTER — Other Ambulatory Visit: Payer: Self-pay

## 2020-07-28 DIAGNOSIS — L578 Other skin changes due to chronic exposure to nonionizing radiation: Secondary | ICD-10-CM

## 2020-07-28 DIAGNOSIS — L988 Other specified disorders of the skin and subcutaneous tissue: Secondary | ICD-10-CM

## 2020-07-28 NOTE — Progress Notes (Signed)
BBL and SkinTyte to neck. See settings in ST log. jj

## 2020-08-01 ENCOUNTER — Other Ambulatory Visit: Payer: Self-pay | Admitting: Physician Assistant

## 2020-08-01 DIAGNOSIS — Z1231 Encounter for screening mammogram for malignant neoplasm of breast: Secondary | ICD-10-CM

## 2020-08-04 ENCOUNTER — Other Ambulatory Visit: Payer: Self-pay | Admitting: Physician Assistant

## 2020-08-04 DIAGNOSIS — Z1231 Encounter for screening mammogram for malignant neoplasm of breast: Secondary | ICD-10-CM

## 2020-09-22 ENCOUNTER — Ambulatory Visit
Admission: RE | Admit: 2020-09-22 | Discharge: 2020-09-22 | Disposition: A | Discharge status: Home / Self Care | Payer: BC Managed Care – PPO | Source: Ambulatory Visit | Attending: Physician Assistant | Admitting: Physician Assistant

## 2020-09-22 DIAGNOSIS — Z1231 Encounter for screening mammogram for malignant neoplasm of breast: Secondary | ICD-10-CM | POA: Diagnosis present

## 2021-03-30 ENCOUNTER — Ambulatory Visit (INDEPENDENT_AMBULATORY_CARE_PROVIDER_SITE_OTHER): Payer: BC Managed Care – PPO | Admitting: Dermatology

## 2021-03-30 ENCOUNTER — Other Ambulatory Visit: Payer: Self-pay

## 2021-03-30 DIAGNOSIS — L255 Unspecified contact dermatitis due to plants, except food: Secondary | ICD-10-CM

## 2021-03-30 DIAGNOSIS — R21 Rash and other nonspecific skin eruption: Secondary | ICD-10-CM | POA: Diagnosis not present

## 2021-03-30 MED ORDER — PREDNISONE 5 MG PO TABS: ORAL_TABLET | ORAL | 0 refills | Status: AC

## 2021-03-30 MED ORDER — CLOBETASOL PROPIONATE 0.05 % EX CREA: 1.0000 "application " | TOPICAL_CREAM | Freq: Two times a day (BID) | CUTANEOUS | 0 refills | Status: AC

## 2021-03-30 NOTE — Patient Instructions (Addendum)
If you have any questions or concerns for your doctor, please call our main line at [phone] and press option 4 to reach your doctor's medical assistant. If no one answers, please leave a voicemail as directed and we will return your call as soon as possible. Messages left after 4 pm will be answered the following business day.   You may also send [place] a message via [place]. We typically respond to MyChart messages within 1-2 business days.  For prescription refills, please ask your pharmacy to contact our office. Our fax number is [phone].  If you have an urgent issue when the clinic is closed that cannot wait until the next business day, you can page your doctor at the number below.    Please note that while we do our best to be available for urgent issues outside of office hours, we are not available 24/7.   If you have an urgent issue and are unable to reach [place], you may choose to seek medical care at your doctor's office, retail clinic, urgent care center, or emergency room.  If you have a medical emergency, please immediately call 911 or go to the emergency department.  Pager Numbers  - Dr. Nehemiah Massed: [phone]  - Dr. Laurence Ferrari: [phone]  - Dr. Nicole Kindred: [phone]  In the event of inclement weather, please call our main line at [phone] for an update on the status of any delays or closures.  Dermatology Medication Tips: Please keep the boxes that topical medications come in in order to help keep track of the instructions about where and how to use these. Pharmacies typically print the medication instructions only on the boxes and not directly on the medication tubes.   If your medication is too expensive, please contact our office at [phone] option 4 or send [place] a message through [place].   We are unable to tell what your co-pay for medications will be in advance as this is different depending on your insurance coverage. However, we may be able to find a substitute  medication at lower cost or fill out paperwork to get insurance to cover a needed medication.   If a prior authorization is required to get your medication covered by your insurance company, please allow [place] 1-2 business days to complete this process.  Drug prices often vary depending on where the prescription is filled and some pharmacies may offer cheaper prices.  The website www.goodrx.com contains coupons for medications through different pharmacies. The prices here do not account for what the cost may be with help from insurance (it may be cheaper with your insurance), but the website can give you the price if you did not use any insurance.  - You can print the associated coupon and take it with your prescription to the pharmacy.  - You may also stop by our office during regular business hours and pick up a GoodRx coupon card.  - If you need your prescription sent electronically to a different pharmacy, notify our office through [facility] or by phone at [phone] option 4.  Risks of prednisone taper discussed including mood irritability, insomnia, weight gain, stomach ulcers, increased risk of infection, increased blood sugar (diabetes), hypertension, osteoporosis with long-term or frequent use, and rare risk of avascular necrosis of the hip.

## 2021-03-30 NOTE — Progress Notes (Signed)
   Follow-Up Visit   Subjective  Helen Trevino is a 60 y.o. female who presents for the following: Rash (Started about 5 days ago. Patient was working out in the yard and has an allergy to poison ivy. She does have a history of histamine issues and tested positive for alpha gal in the past. Currently she is using Sernivo spray QHS, OTC Benadryl and OTC poison ivy/oak shampoo.).  The following portions of the chart were reviewed this encounter and updated as appropriate:   Tobacco  Allergies  Meds  Problems  Med Hx  Surg Hx  Fam Hx     Review of Systems:  No other skin or systemic complaints except as noted in HPI or Assessment and Plan.  Objective  Well appearing patient in no apparent distress; mood and affect are within normal limits.  A focused examination was performed including the face, trunk, extremities. Relevant physical exam findings are noted in the Assessment and Plan.   Assessment & Plan  Rash Scalp, face, trunk, extremities Consistent with contact dermatitis to poison ivy -  Severe and generalized involving the face and requiring systemic steroids  Start Clobetasol cream to aa's QD PRN.  Topical steroids (such as triamcinolone, fluocinolone, fluocinonide, mometasone, clobetasol, halobetasol, betamethasone, hydrocortisone) can cause thinning and lightening of the skin if they are used for too long in the same area. Your physician has selected the right strength medicine for your problem and area affected on the body. Please use your medication only as directed by your physician to prevent side effects.   Continue Desonide cream to aa's face QD PRN.   Start Prednisone 3-week taper 60 mg to 0 po as instructed - instruction sheet given to patient. Side effects reviewed with patient.   clobetasol cream (TEMOVATE) 0.05 % - Scalp, face, trunk, extremities Apply 1 application topically 2 (two) times daily. Apply to aa's rash QD PRN.  predniSONE (DELTASONE) 5 MG tablet -  Scalp, face, trunk, extremities Use as instruction sheet directs.  Return for appointment as scheduled - TBSE .  Luther Redo, CMA, am acting as scribe for Sarina Ser, MD . Documentation: I have reviewed the above documentation for accuracy and completeness, and I agree with the above.  Sarina Ser, MD

## 2021-04-04 ENCOUNTER — Encounter: Payer: Self-pay | Admitting: Dermatology

## 2021-07-23 ENCOUNTER — Other Ambulatory Visit: Payer: Self-pay

## 2021-07-23 ENCOUNTER — Encounter: Payer: BC Managed Care – PPO | Admitting: Dermatology

## 2021-07-23 ENCOUNTER — Ambulatory Visit (INDEPENDENT_AMBULATORY_CARE_PROVIDER_SITE_OTHER): Payer: BC Managed Care – PPO | Admitting: Dermatology

## 2021-07-23 DIAGNOSIS — M67441 Ganglion, right hand: Secondary | ICD-10-CM

## 2021-07-23 DIAGNOSIS — M67449 Ganglion, unspecified hand: Secondary | ICD-10-CM

## 2021-07-23 DIAGNOSIS — L820 Inflamed seborrheic keratosis: Secondary | ICD-10-CM | POA: Diagnosis not present

## 2021-07-23 DIAGNOSIS — D1801 Hemangioma of skin and subcutaneous tissue: Secondary | ICD-10-CM

## 2021-07-23 DIAGNOSIS — D229 Melanocytic nevi, unspecified: Secondary | ICD-10-CM

## 2021-07-23 DIAGNOSIS — L814 Other melanin hyperpigmentation: Secondary | ICD-10-CM

## 2021-07-23 DIAGNOSIS — L578 Other skin changes due to chronic exposure to nonionizing radiation: Secondary | ICD-10-CM | POA: Diagnosis not present

## 2021-07-23 DIAGNOSIS — Z1283 Encounter for screening for malignant neoplasm of skin: Secondary | ICD-10-CM

## 2021-07-23 DIAGNOSIS — L720 Epidermal cyst: Secondary | ICD-10-CM

## 2021-07-23 DIAGNOSIS — L821 Other seborrheic keratosis: Secondary | ICD-10-CM

## 2021-07-23 NOTE — Patient Instructions (Signed)

## 2021-07-23 NOTE — Progress Notes (Signed)
Follow-Up Visit   Subjective  Helen Trevino is a 60 y.o. female who presents for the following: Annual Exam (Mole check ). The patient presents for Total-Body Skin Exam (TBSE) for skin cancer screening and mole check.  The patient has spots, moles and lesions to be evaluated, some may be new or changing and the patient has concerns that these could be cancer.   The following portions of the chart were reviewed this encounter and updated as appropriate:   Tobacco  Allergies  Meds  Problems  Med Hx  Surg Hx  Fam Hx     Review of Systems:  No other skin or systemic complaints except as noted in HPI or Assessment and Plan.  Objective  Well appearing patient in no apparent distress; mood and affect are within normal limits.  A full examination was performed including scalp, head, eyes, ears, nose, lips, neck, chest, axillae, abdomen, back, buttocks, bilateral upper extremities, bilateral lower extremities, hands, feet, fingers, toes, fingernails, and toenails. All findings within normal limits unless otherwise noted below.  left mid back paraspinal 1.2 cm Subcutaneous nodule.   right middle finger at PIP joint Solitary, smooth skin colored to translucent papule.   right forehead x 1 Erythematous keratotic or waxy stuck-on papule or plaque.     Assessment & Plan  Epidermal cyst left mid back paraspinal Benign-appearing. Exam most consistent with an epidermal inclusion cyst. Discussed that a cyst is a benign growth that can grow over time and sometimes get irritated or inflamed. Recommend observation if it is not bothersome. Discussed option of surgical excision to remove it if it is growing, symptomatic, or other changes noted. Please call for new or changing lesions so they can be evaluated.   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 Discussed that a  cyst is a benign growth that can grow over time and sometimes get irritated or inflamed. Recommend observation if it is not bothersome.  Inflamed seborrheic keratosis right forehead x 1 Reassured benign age-related growth.  Recommend observation.  Discussed cryotherapy if spot(s) become irritated or inflamed.   Prior to procedure, discussed risks of blister formation, small wound, skin dyspigmentation, or rare scar following cryotherapy. Recommend Vaseline ointment to treated areas while healing.   Destruction of lesion - right forehead x 1 Complexity: simple   Destruction method: cryotherapy   Informed consent: discussed and consent obtained   Timeout:  patient name, date of birth, surgical site, and procedure verified Lesion destroyed using liquid nitrogen: Yes   Region frozen until ice ball extended beyond lesion: Yes   Outcome: patient tolerated procedure well with no complications   Post-procedure details: wound care instructions given    Skin cancer screening  Lentigines - Scattered tan macules - Due to sun exposure - Benign-appearing, observe - Recommend daily broad spectrum sunscreen SPF 30+ to sun-exposed areas, reapply every 2 hours as needed. - Call for any changes  Seborrheic Keratoses - Stuck-on, waxy, tan-brown papules and/or plaques  - Benign-appearing - Discussed benign etiology and prognosis. - Observe - Call for any changes  Melanocytic Nevi - Tan-brown and/or pink-flesh-colored symmetric macules and papules - Benign appearing on exam today - Observation - Call clinic for new or changing moles - Recommend daily use of broad spectrum spf 30+ sunscreen to sun-exposed areas.   Hemangiomas- under dermatoscopy Left anterior vertex scalp  - Red  papules - Discussed benign nature - Observe - Call for any changes  Actinic Damage - Chronic condition, secondary to cumulative UV/sun exposure - diffuse scaly erythematous macules with underlying  dyspigmentation - Recommend daily broad spectrum sunscreen SPF 30+ to sun-exposed areas, reapply every 2 hours as needed.  - Staying in the shade or wearing long sleeves, sun glasses (UVA+UVB protection) and wide brim hats (4-inch brim around the entire circumference of the hat) are also recommended for sun protection.  - Call for new or changing lesions.  Skin cancer screening performed today.   Return in about 1 year (around 07/23/2022) for TBSE, .  I, Marye Round, CMA, am acting as scribe for Sarina Ser, MD .  Documentation: I have reviewed the above documentation for accuracy and completeness, and I agree with the above.  Sarina Ser, MD

## 2021-08-01 ENCOUNTER — Encounter: Payer: Self-pay | Admitting: Dermatology

## 2021-08-24 ENCOUNTER — Other Ambulatory Visit: Payer: Self-pay | Admitting: Physician Assistant

## 2021-08-24 DIAGNOSIS — Z1231 Encounter for screening mammogram for malignant neoplasm of breast: Secondary | ICD-10-CM

## 2021-09-23 ENCOUNTER — Other Ambulatory Visit: Payer: Self-pay

## 2021-09-23 ENCOUNTER — Ambulatory Visit
Admission: RE | Admit: 2021-09-23 | Discharge: 2021-09-23 | Disposition: A | Discharge status: Home / Self Care | Payer: BC Managed Care – PPO | Source: Ambulatory Visit | Attending: Physician Assistant | Admitting: Physician Assistant

## 2021-09-23 DIAGNOSIS — Z1231 Encounter for screening mammogram for malignant neoplasm of breast: Secondary | ICD-10-CM | POA: Diagnosis present

## 2021-10-12 ENCOUNTER — Other Ambulatory Visit: Payer: Self-pay

## 2021-10-12 ENCOUNTER — Ambulatory Visit: Payer: BC Managed Care – PPO | Attending: Physician Assistant

## 2021-10-12 DIAGNOSIS — R2681 Unsteadiness on feet: Secondary | ICD-10-CM | POA: Diagnosis present

## 2021-10-12 DIAGNOSIS — R42 Dizziness and giddiness: Secondary | ICD-10-CM | POA: Insufficient documentation

## 2021-10-12 DIAGNOSIS — R2689 Other abnormalities of gait and mobility: Secondary | ICD-10-CM | POA: Insufficient documentation

## 2021-10-12 DIAGNOSIS — M6281 Muscle weakness (generalized): Secondary | ICD-10-CM | POA: Insufficient documentation

## 2021-10-12 NOTE — Therapy (Signed)
[place] [facility] SERVICES [address] [place], [place], [address] Phone: [phone]   Fax:  [phone]  Physical Therapy Evaluation  Patient Details  Name: Helen Trevino MRN: [id number] Date of Birth: Oct 30, 1960 Referring Provider (PT): Vladimir Crofts, MD   Encounter Date: 10/12/2021   PT End of Session - 10/12/21 1541     Visit Number 1    Number of Visits 25    Date for PT Re-Evaluation 01/04/22    PT Start Time 1503    PT Stop Time 1600    PT Time Calculation (min) 57 min    Equipment Utilized During Treatment Gait belt    Activity Tolerance Patient tolerated treatment well    Behavior During Therapy [facility] for tasks assessed/performed             Past Medical History:  Diagnosis Date   Meniere's disease    Seasonal allergies     Past Surgical History:  Procedure Laterality Date   HAND SURGERY  1983   WISDOM TOOTH EXTRACTION  1979    There were no vitals filed for this visit.    Subjective Assessment - 10/12/21 1504     Subjective Pt is a pleasant 61 yo female presenting to outpatient PT with c/o dizziness and imbalance. 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 She reports she has had migraines for many years, but that dizziness and unsteadiness has worsened in the past two months. She reports lingering symptoms between migraine attacks. She reports episodes of vertigo that can last 30 min to hours. Pt has been to an ENT, she reports they ruled out meniere's disease (this was several years ago). Pt has not had hearing tested recently.  Her symptoms include: tinnitus, ear ache, nausea, metalic taste in her mouth with migraines, headaches, vertigo, fatigue, "wax paper" feeling over eyes, light sensitivity and noise sensitivity. She has difficulty with screens, busy stores. She reports pain and tightness felt in her neck, primarily posteriorly. When pt has a headache she feels it primarily on R side and top center of her  head. Pt reports she has been diagnosed with a "histamine issue" and is on a "low histamine diet."  Pt reports during migraine episodes she finds it helps best to be still and quiet. She will use heat and ice over her forehead. Pt reports one recent fall on Valentine's day when going to hug her daughter. She reports she fell into her couch. Pt does report history of vestibular rehab, but that it was years ago. Pt wears glasses.    Pertinent History Pt is a pleasant 61 yo female presenting to outpatient PT with c/o dizziness and imbalance. 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 She reports she has had migraines for many years, but that dizziness and unsteadiness has worsened in the past two months. She reports lingering symptoms between migraine attacks. She reports episodes of vertigo that can last 30 min to hours. Pt has been to an ENT, she reports they ruled out meniere's disease (this was several years ago). Pt has not had hearing tested recently.  Her symptoms include: tinnitus, ear ache, nausea, metalic taste in her mouth with migraines, headaches, vertigo, fatigue, "wax paper" feeling over eyes, light sensitivity and noise sensitivity. She has difficulty with screens, busy stores. She reports pain and tightness felt in her neck, primarily posteriorly. When pt has a headache she feels it primarily on R side and top center of her  head. Pt reports she has been diagnosed with a "histamine issue" and is on a "low histamine diet."  Pt reports during migraine episodes she finds it helps best to be still and quiet. She will use heat and ice over her forehead. Pt reports one recent fall on Valentine's day when going to hug her daughter. She reports she fell into her couch. Pt does report history of vestibular rehab, but that it was years ago. Pt wears glasses. Per chart PMH is significant for Intractable migraine with vestibular symptoms, anxiety.    Limitations House hold activities;Standing;Walking;Lifting     How long can you sit comfortably? not affected    How long can you stand comfortably? at times limited d/t unsteadiness/dizziness    How long can you walk comfortably? limited due to unsteadiness, dizziness    Diagnostic tests no recent pertinent testing    Patient Stated Goals Pt would like to manage her symptoms better and "ease back into" daily activities    Currently in Pain? No/denies            VESTIBULAR AND BALANCE EVALUATION   HISTORY:  Subjective history of current problem: Pt with migraines for many years, including dizziness. Symptoms have worsened in last two months. See subjective for details.   Description of dizziness: (vertigo, unsteadiness, lightheadedness, falling, general unsteadiness, whoozy, swimmy-headed sensation, aural fullness): Pt endorses vertigo, unsteadiness, recent fall, swimmy-headed sensation, aural fullness  Frequency: Pt sx can be triggered, thinks weather might be a trigger, diet. Pt hx of migraines with dizziness for many years. Duration: 30 min to an hr Symptom nature: (motion provoked, positional, spontaneous, constant, variable, intermittent): Pt reports motion provoked, bending, turning her head (says this has improved over time with prior vestibular rehab).   Easing Factors: being still, quiet   Progression of symptoms: (better, worse, no change since onset): Pt reports some improvements with medication changes, but increased dizziness over past couple months  History of similar episodes: yes, ongoing problem for several years  Falls (yes/no): Yes Number of falls in past 6 months: 1, see subjective for details   Auditory complaints (tinnitus, pain, drainage, hearing loss, aural fullness): Vision (diplopia, visual field loss, recent changes, last eye exam): tinnitus and aural fullness  Red Flags: (dysarthria, dysphagia, drop attacks, bowel and bladder changes, recent weight loss/gain) Review of systems negative for red flags: Pt with  no red flags per report    EXAMINATION     SOMATOSENSORY:  Sensation: Pt reports L hand can feel numb and weak at times.     MUSCULOSKELETAL SCREEN:   ROM:   Cervical: Lateral flexion- R: 40 deg. Pt reports tightness L. 35 deg. Pt reports tightness Extension 36 deg. Pt reports tightness Flexion: 43 deg. Pt reports tightness  Rotation R: 72  Rotation L: 82   Palpation: Pt TTP in B upper traps  MMT:   UE: deferred  Functional Mobility:  Gait: Scanning of visual environment with gait is impaired due to dizziness. Pt unsteady with gait if pt must turn head/scan environment.   OCULOMOTOR / VESTIBULAR TESTING:  Oculomotor Exam- Room Light  Findings Comments  Ocular Alignment normal   Ocular ROM normal *has normal ROM but dizziness with visual tracking  Spontaneous Nystagmus normal   Gaze-Holding Nystagmus normal   End-Gaze Nystagmus normal   Vergence (normal 2-3") abnormal Reports double about 8 inches away with finger, unclear if sees double further back due to finger movement affecting pt's vision/gaze stability   Smooth Pursuit abnormal  Saccadic, pt reports vision is "twitchy"  Saccades abnormal Pt reports wooziness with saccades, does not feel she can speed up due to wooziness  Left Head Impulse abnormal Pt very woozy, but does not exhibit catch up saccade,   Right Head Impulse abnormal Pt very woozy but does not exhibit catch up saccade    Static Acuity normal   Dynamic Acuity abnormal       BPPV TESTS: deferred  Symptoms Duration Intensity Nystagmus  L Dix-Hallpike      R Dix-Hallpike      L Head Roll      R Head Roll      L Sidelying Test      R Sidelying Test        FUNCTIONAL OUTCOME MEASURES   Results Comments  DGI deferred   FOTO 52%   10 Meter Gait Speed Self-selected: s = m/s; Fastest: s = m/s Below normative values for full community ambulation  ABC Scale 41.9%   DHI 48/100 Moderate perception of handicap    Issued and Reviewed  HEP with pt: Access Code: [email or web address] URL: [email or web address] Date: 10/12/2021 Prepared by: Ricard Dillon  Exercises Seated Gaze Stabilization with Head Rotation - 1 x daily - 7 x weekly - 1 sets - 3 reps - 60 hold  Issued symptom tracker handouts.   ASSESSMENT Clinical Impression: Pt is a pleasant 61 year-old female referred for dizziness related to migraine and unsteadiness with reports of 1 recent fall. PT examination reveals abnormal saccades, smooth pursuits, vergence and head impulse testing all likely due to impaired gaze stability, however, will continue to monitor. Pt also with impaired LUE sensation per report (numbness, weakness in hand), and impaired cervical ROM and pain/tightness. Pt gait is affected due to unsteadiness with scanning/head turns. Pt's DHI, FOTO and ABC scale scores indicate moderate handicap due to dizziness, decreased QOL and functional mobility and decreased balance confidence. Further assessment to be completed next 1-2 sessions. The pt will benefit from further skilled PT to address these deficits in order to reduce risk for future falls and to improve balance and QOL.   Pt educated throughout session about proper posture and technique with exercises. Improved exercise technique, movement at target joints, use of target muscles after min to mod verbal, visual, tactile cues.      PT Education - 10/13/21 1712     Education Details exam findings, indications for PT/POC, goals, HEP and symptom tracker    Person(s) Educated Patient    Methods Explanation;Demonstration;Verbal cues;Handout;Tactile cues    Comprehension Verbalized understanding;Returned demonstration;Verbal cues required;Need further instruction              PT Short Term Goals - 10/12/21 1539       PT SHORT TERM GOAL #1   Title Pt will be independent with HEP in order to improve strength and balance in order to decrease fall risk and improve function at home and work.     Baseline 2/27: issued on this date    Time 6    Period Weeks    Status New    Target Date 11/23/21               PT Long Term Goals - 10/12/21 1539       PT LONG TERM GOAL #1   Title Pt will decrease DHI score by at least 18 points in order to demonstrate clinically significant reduction in disability    Baseline 2/27: 48    Time 12  Period Weeks    Status New    Target Date 01/04/22      PT LONG TERM GOAL #2   Title Pt will report at least 50% reduction in symptoms in order to improve function at home and ability to participate in community activities.    Baseline 2/27: pt currently limited due to symptoms    Time 12    Period Weeks    Status New    Target Date 01/04/22      PT LONG TERM GOAL #3   Title Pt will improve DGI by at least 3 points in order to demonstrate clinically significant improvement in balance and decreased risk for falls.    Baseline 2/27: to be assessed next 1-2 sessions    Time 12    Period Weeks    Target Date 01/04/22      PT LONG TERM GOAL #4   Title Pt will improve ABC by at least 13% in order to demonstrate clinically significant improvement in balance confidence.    Baseline 2/27: 41.9%    Time 12    Period Weeks    Status New    Target Date 01/04/22      PT LONG TERM GOAL #5   Title Patient will increase FOTO score to equal to or greater than 57  to demonstrate statistically significant improvement in mobility and quality of life.    Baseline 2/27: 52%    Time 12    Period Weeks    Status New    Target Date 01/04/22                    Plan - 10/12/21 1542     Clinical Impression Statement Pt is a pleasant 61 year-old female referred for dizziness related to migraine and unsteadiness with reports of 1 recent fall. PT examination reveals abnormal saccades, smooth pursuits, vergence and head impulse testing all likely due to impaired gaze stability, however, will continue to monitor. Pt also with impaired LUE sensation  per report (numbness, weakness in hand), and impaired cervical ROM and pain/tightness. Pt gait is affected due to unsteadiness with scanning/head turns. Pt's DHI, FOTO and ABC scale scores indicate moderate handicap due to dizziness, decreased QOL and functional mobility and decreased balance confidence. Further assessment to be completed next 1-2 sessions. The pt will benefit from further skilled PT to address these deficits in order to reduce risk for future falls and to improve balance and QOL.    Personal Factors and Comorbidities Age;Time since onset of injury/illness/exacerbation;Sex;Fitness;Comorbidity 2    Comorbidities anxiety, Intractable migraine with vestibular symptoms    Examination-Activity Limitations Bend;Lift;Stand;Locomotion Level;Stairs;Reach Overhead;Bed Mobility    Examination-Participation Restrictions Volunteer;Community Activity;Shop;Yard Work;Cleaning;Driving;Laundry;Meal Prep    Stability/Clinical Decision Making Evolving/Moderate complexity    Clinical Decision Making Moderate    Rehab Potential Fair    PT Frequency 2x / week    PT Duration 12 weeks    PT Treatment/Interventions ADLs/Self Care Home Management;Biofeedback;Canalith Repostioning;Cryotherapy;Electrical Stimulation;Moist Heat;Traction;Ultrasound;DME Instruction;Gait Scientist, forensic;Therapeutic exercise;Therapeutic activities;Functional mobility training;Balance training;Neuromuscular re-education;Patient/family education;Orthotic Fit/Training;Wheelchair mobility training;Manual techniques;Passive range of motion;Dry needling;Energy conservation;Taping;Vestibular;Visual/perceptual remediation/compensation;Joint Manipulations;Spinal Manipulations;Parrafin    PT Next Visit Plan complete further assessment including DGI    PT Home Exercise Plan Access Code: [email or web address]    Consulted and Agree with Plan of Care Patient             Patient will benefit from skilled therapeutic intervention in order to  improve the following deficits and impairments:  Abnormal gait, Decreased balance, Decreased mobility, Dizziness, Decreased strength, Decreased activity tolerance, Decreased range of motion, Hypomobility, Impaired sensation, Improper body mechanics, Pain, Difficulty walking, Impaired flexibility, Increased muscle spasms, Impaired vision/preception  Visit Diagnosis: Dizziness and giddiness  Unsteadiness on feet  Other abnormalities of gait and mobility  Muscle weakness (generalized)     Problem List There are no problems to display for this patient.   Zollie Pee, PT 10/13/2021, 7:08 PM  [place] [facility] SERVICES [address] [place], [place], [address] Phone: [phone]   Fax:  [phone]  Name: GERYL DOHN MRN: [id number] Date of Birth: 1960/12/11

## 2021-10-19 ENCOUNTER — Ambulatory Visit: Payer: BC Managed Care – PPO | Attending: Physician Assistant

## 2021-10-19 ENCOUNTER — Other Ambulatory Visit: Payer: Self-pay

## 2021-10-19 DIAGNOSIS — R2689 Other abnormalities of gait and mobility: Secondary | ICD-10-CM | POA: Insufficient documentation

## 2021-10-19 DIAGNOSIS — M542 Cervicalgia: Secondary | ICD-10-CM | POA: Diagnosis present

## 2021-10-19 DIAGNOSIS — R2681 Unsteadiness on feet: Secondary | ICD-10-CM | POA: Insufficient documentation

## 2021-10-19 DIAGNOSIS — M6281 Muscle weakness (generalized): Secondary | ICD-10-CM | POA: Insufficient documentation

## 2021-10-19 DIAGNOSIS — R42 Dizziness and giddiness: Secondary | ICD-10-CM | POA: Insufficient documentation

## 2021-10-19 NOTE — Therapy (Signed)
[place] MAIN [facility] SERVICES [address] [place], [place], [address] Phone: [phone]   Fax:  [phone]  Physical Therapy Treatment  Patient Details  Name: Helen Trevino MRN: [id number] Date of Birth: [birth year] Referring Provider (PT): Vladimir Crofts, MD   Encounter Date: 10/19/2021   PT End of Session - 10/19/21 1117     Visit Number 2    Number of Visits 25    Date for PT Re-Evaluation 01/04/22    PT Start Time 0914    PT Stop Time 1000    PT Time Calculation (min) 46 min    Equipment Utilized During Treatment Gait belt    Activity Tolerance Patient tolerated treatment well    Behavior During Therapy [facility] for tasks assessed/performed             Past Medical History:  Diagnosis Date   Meniere's disease    Seasonal allergies     Past Surgical History:  Procedure Laterality Date   HAND SURGERY  1983   WISDOM TOOTH EXTRACTION  1979    There were no vitals filed for this visit.   Subjective Assessment - 10/19/21 0912     Subjective Pt reports dizziness on-and-off since last session. Pt had a good symptom day Saturday. She reports another day she felt very limited due to dizziness. Pt reports doing her HEP. She reports dizziness response was mild-moderate. Pt reports no falls but some stumbling on carpet.    Pertinent History Pt is a pleasant 61 yo female presenting to outpatient PT with c/o dizziness and imbalance. 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 She reports she has had migraines for many years, but that dizziness and unsteadiness has worsened in the past two months. She reports lingering symptoms between migraine attacks. She reports episodes of vertigo that can last 30 min to hours. Pt has been to an ENT, she reports they ruled out meniere's disease (this was several years ago). Pt has not had hearing tested recently.  Her symptoms include: tinnitus, ear ache, nausea, metalic taste in her mouth with migraines,  headaches, vertigo, fatigue, "wax paper" feeling over eyes, light sensitivity and noise sensitivity. She has difficulty with screens, busy stores. She reports pain and tightness felt in her neck, primarily posteriorly. When pt has a headache she feels it primarily on R side and top center of her head. Pt reports she has been diagnosed with a "histamine issue" and is on a "low histamine diet."  Pt reports during migraine episodes she finds it helps best to be still and quiet. She will use heat and ice over her forehead. Pt reports one recent fall on Valentine's day when going to hug her daughter. She reports she fell into her couch. Pt does report history of vestibular rehab, but that it was years ago. Pt wears glasses. Per chart PMH is significant for Intractable migraine with vestibular symptoms, anxiety.    Limitations House hold activities;Standing;Walking;Lifting    How long can you sit comfortably? not affected    How long can you stand comfortably? at times limited d/t unsteadiness/dizziness    How long can you walk comfortably? limited due to unsteadiness, dizziness    Diagnostic tests no recent pertinent testing    Patient Stated Goals Pt would like to manage her symptoms better and "ease back into" daily activities    Currently in Pain? No/denies             INTERVENTIONS -  Manual therapy, supine on plinth:  PT provides STM and TrP release to B posterior shoulder musculature, B UTs, B cervical paraspinals and musculature of occipital triangle. PT additionally provides gentle cervical traction. Pt with TrPs mostly throughout L cervical paraspinals, however, TrPs noted in B occipital triangle musculature. X 10 min  TherEx- Seated: Upper trap stretch 2x30 sec B Cervical rotation stretch 2x30 sec B Pt observed with limited ROM.  UE MMT: 4/5 B    Neuro- DGI: 17/24  Dix-Hallpike testing: negative B  Pt standing on airex, at support surface with CGA provided throughout unless  otherwise specified: WBOS EO  30 sec WBOS EC  60 sec  NBOS EO 60 sec NBOS EC 60 sec WBOS EO with vertical and horizontal head turns 10x each in both directions Pt rates as medium-hard, requires up to min assist, particularly with NBOS, EC and head turns. Tendency to go backwards and to L side.   Standing VORx1, NBOS, plain background, Vertical head turns 90 sec Horizontal head turns 60 sec Rates dizziness as mild, uses recovery interval.  Body rolls 4x length of wall Discontinued due to increase of dizziness, pre-nausea feelings. Improves with rest.  PT instructs pt through HEP addition: Access Code: [email or web address] URL: [email or web address] Date: 10/19/2021 Prepared by: Ricard Dillon  Exercises Seated Upper Trapezius Stretch - 1 x daily - 7 x weekly - 1 sets - 2 reps - 30 hold Seated Cervical Rotation Stretch - 1 x daily - 7 x weekly - 1 sets - 2 reps - 30 hold   Pt educated throughout session about proper posture and technique with exercises. Improved exercise technique, movement at target joints, use of target muscles after min to mod verbal, visual, tactile cues.     [facility] PT Assessment - 10/19/21 0001       Standardized Balance Assessment   Standardized Balance Assessment Dynamic Gait Index      Dynamic Gait Index   Level Surface Normal    Change in Gait Speed Normal    Gait with Horizontal Head Turns Moderate Impairment    Gait with Vertical Head Turns Moderate Impairment    Gait and Pivot Turn Mild Impairment    Step Over Obstacle Normal    Step Around Obstacles Mild Impairment    Steps Mild Impairment    Total Score 17                  PT Education - 10/19/21 1113     Education Details exercise technique, addition to HEP    Person(s) Educated Patient    Methods Explanation;Demonstration;Verbal cues    Comprehension Verbalized understanding;Returned demonstration;Verbal cues required;Need further instruction              PT Short Term  Goals - 10/12/21 1539       PT SHORT TERM GOAL #1   Title Pt will be independent with HEP in order to improve strength and balance in order to decrease fall risk and improve function at home and work.    Baseline 2/27: issued on this date    Time 6    Period Weeks    Status New    Target Date 11/23/21               PT Long Term Goals - 10/19/21 1121       PT LONG TERM GOAL #1   Title Pt will decrease DHI score by at least 18 points in order to demonstrate clinically significant reduction in  disability    Baseline 2/27: 48    Time 12    Period Weeks    Status New    Target Date 01/04/22      PT LONG TERM GOAL #2   Title Pt will report at least 50% reduction in symptoms in order to improve function at home and ability to participate in community activities.    Baseline 2/27: pt currently limited due to symptoms    Time 12    Period Weeks    Status New    Target Date 01/04/22      PT LONG TERM GOAL #3   Title Pt will improve DGI by at least 3 points in order to demonstrate clinically significant improvement in balance and decreased risk for falls.    Baseline 2/27: to be assessed next 1-2 sessions; 3/6: 17/24    Time 12    Period Weeks    Status New    Target Date 01/04/22      PT LONG TERM GOAL #4   Title Pt will improve ABC by at least 13% in order to demonstrate clinically significant improvement in balance confidence.    Baseline 2/27: 41.9%    Time 12    Period Weeks    Status New    Target Date 01/04/22      PT LONG TERM GOAL #5   Title Patient will increase FOTO score to equal to or greater than 57  to demonstrate statistically significant improvement in mobility and quality of life.    Baseline 2/27: 52%    Time 12    Period Weeks    Status New    Target Date 01/04/22                   Plan - 10/19/21 1118     Clinical Impression Statement Further assessment completed. Pt with negative B Dix-Hallpike. Pt scored 17/24 on DGI indicating pt  is at an increased risk for falls. She was most challenged on DGI tasks and with interventions that utilized head turns, requiring up to min assist to maintain balance. Pt also found to be TTP throughout cervical paraspinals, and most senstivie in musculature of occipital triange. Pt UE MMT revealed gross BUE strength as 4/5 and pt contiued to exhibit limitations in cerviical ROM. The pt will benefit from further skilled PT to address these deficits in order to improve balance, motion provoked dizziness, and to reduce fall risk.    Personal Factors and Comorbidities Age;Time since onset of injury/illness/exacerbation;Sex;Fitness;Comorbidity 2    Comorbidities anxiety, Intractable migraine with vestibular symptoms    Examination-Activity Limitations Bend;Lift;Stand;Locomotion Level;Stairs;Reach Overhead;Bed Mobility    Examination-Participation Restrictions Volunteer;Community Activity;Shop;Yard Work;Cleaning;Driving;Laundry;Meal Prep    Stability/Clinical Decision Making Evolving/Moderate complexity    Rehab Potential Fair    PT Frequency 2x / week    PT Duration 12 weeks    PT Treatment/Interventions ADLs/Self Care Home Management;Biofeedback;Canalith Repostioning;Cryotherapy;Electrical Stimulation;Moist Heat;Traction;Ultrasound;DME Instruction;Gait Scientist, forensic;Therapeutic exercise;Therapeutic activities;Functional mobility training;Balance training;Neuromuscular re-education;Patient/family education;Orthotic Fit/Training;Wheelchair mobility training;Manual techniques;Passive range of motion;Dry needling;Energy conservation;Taping;Vestibular;Visual/perceptual remediation/compensation;Joint Manipulations;Spinal Manipulations;Parrafin    PT Next Visit Plan complete further assessment including DGI    PT Home Exercise Plan Access Code: [email or web address]; Access Code: [email or web address]    Consulted and Agree with Plan of Care Patient             Patient will benefit from skilled therapeutic intervention  in order to improve the following deficits and impairments:  Abnormal gait, Decreased balance, Decreased mobility, Dizziness, Decreased  strength, Decreased activity tolerance, Decreased range of motion, Hypomobility, Impaired sensation, Improper body mechanics, Pain, Difficulty walking, Impaired flexibility, Increased muscle spasms, Impaired vision/preception  Visit Diagnosis: Dizziness and giddiness  Unsteadiness on feet  Other abnormalities of gait and mobility  Muscle weakness (generalized)     Problem List There are no problems to display for this patient.   Zollie Pee, PT 10/19/2021, 11:27 AM  [place] MAIN [facility] SERVICES [address] [place], [place], [address] Phone: [phone]   Fax:  [phone]  Name: Helen Trevino MRN: [id number] Date of Birth: 04/13/1961

## 2021-11-03 ENCOUNTER — Ambulatory Visit: Payer: BC Managed Care – PPO

## 2021-11-03 ENCOUNTER — Other Ambulatory Visit: Payer: Self-pay

## 2021-11-03 DIAGNOSIS — R42 Dizziness and giddiness: Secondary | ICD-10-CM | POA: Diagnosis not present

## 2021-11-03 DIAGNOSIS — M542 Cervicalgia: Secondary | ICD-10-CM

## 2021-11-03 DIAGNOSIS — R2681 Unsteadiness on feet: Secondary | ICD-10-CM

## 2021-11-03 NOTE — Therapy (Signed)
[place] [facility] [facility] ?[place], [place], [address] ?Phone: [phone]   Fax:  [phone] ? ?Physical Therapy Treatment ? ?Patient Details  ?Name: Helen Trevino ?MRN: [id number] ?Date of Birth: [birth year] ?Referring Provider (PT): Vladimir Crofts, MD ? ? ?Encounter Date: 11/03/2021 ? ? PT End of Session - 11/03/21 1517   ? ? Visit Number 3   ? Number of Visits 25   ? Date for PT Re-Evaluation 01/04/22   ? PT Start Time [phone]   ? PT Stop Time 0959   ? PT Time Calculation (min) 41 min   ? Equipment Utilized During Treatment Gait belt   ? Activity Tolerance Patient tolerated treatment well   ? Behavior During Therapy [facility] for tasks assessed/performed   ? ?  ?  ? ?  ? ? ?Past Medical History:  ?Diagnosis Date  ? Meniere's disease   ? Seasonal allergies   ? ? ?Past Surgical History:  ?Procedure Laterality Date  ? HAND SURGERY  1983  ? [place]  ? ? ?There were no vitals filed for this visit. ? ? Subjective Assessment - 11/03/21 0920   ? ? Subjective Pt reports she has had several good days of decreased symptoms. Pt reports she has been able to do more in general. Pt is happy that the "noise level" in her ears has gone down. She reports improved ease with her VOR with horziontal head turns (her HEP). Pt rates her dizziness as 1.5/10 currently. Pt reports continued stumbles but no falls.   ? Pertinent History Pt is a pleasant 61 yo female presenting to outpatient PT with c/o dizziness and imbalance. 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 She reports she has had migraines for many years, but that dizziness and unsteadiness has worsened in the past two months. She reports lingering symptoms between migraine attacks. She reports episodes of vertigo that can last 30 min to hours. Pt has been to an ENT, she reports they ruled out meniere's disease (this was several years ago). Pt has not had hearing tested recently.  Her symptoms include:  tinnitus, ear ache, nausea, metalic taste in her mouth with migraines, headaches, vertigo, fatigue, "wax paper" feeling over eyes, light sensitivity and noise sensitivity. She has difficulty with screens, busy stores. She reports pain and tightness felt in her neck, primarily posteriorly. When pt has a headache she feels it primarily on R side and top center of her head. Pt reports she has been diagnosed with a "histamine issue" and is on a "low histamine diet."  Pt reports during migraine episodes she finds it helps best to be still and quiet. She will use heat and ice over her forehead. Pt reports one recent fall on Valentine's day when going to hug her daughter. She reports she fell into her couch. Pt does report history of vestibular rehab, but that it was years ago. Pt wears glasses. Per chart PMH is significant for Intractable migraine with vestibular symptoms, anxiety.   ? Limitations House hold activities;Standing;Walking;Lifting   ? How long can you sit comfortably? not affected   ? How long can you stand comfortably? at times limited d/t unsteadiness/dizziness   ? How long can you walk comfortably? limited due to unsteadiness, dizziness   ? Diagnostic tests no recent pertinent testing   ? Patient Stated Goals Pt would like to manage her symptoms better and "ease back into" daily activities   ? Currently  in Pain? No/denies   ? ?  ?  ? ?  ? ? ? ? ?INTERVENTIONS -  ?  ?  ?Manual therapy, supine on plinth: x 20 minutes ?PT provides STM and TrP release to B posterior shoulder musculature, B UTs, B cervical paraspinals and musculature of occipital triangle with focus on R occipital musculature (pt most TTP in this region).   ?-While providing manual PT brings pt through the following sustained stretch positions: cervical lateral flexion B, cervical rotation B and cervical flexion. ?-PT additionally provides gentle cervical traction. Pt reports most improvement with traction and TrP release focused on R occipital  musculature. Pt reported tightness/stretch felt with all stretches through minimal ROM.  ? ?TherEx- ?Chin tucks 10x 3 sec holds in supine ?Seated scapular squeezes 10x with 1-2 sec holds; pt reports numbness felt in R ant. Delt that improved with shoulder circles (see below). ?Shoulder circles CW 10x, reports improves numbness symptoms.  ?  ? ?NMR- ? ?Pt standing on airex, at support surface with CGA provided throughout unless otherwise specified: ?NBOS EC on Airex 4x30 sec. Pt requires up to min a due to unsteadiness. Pt utilizes intermittent UE support. She does improve postural stability with reps.  ?Standing VORx1, WBOS, airex plain background while performing the following: ?Vertical head turns 60 sec ?Horizontal head turns 2x60 sec ?Reports dizziness as no more than a 2/10, exhibits increased sway ?Pt utilizes recovery intervals with PT monitoring for response. ? ?PT instructed pt in 2:4 breathing technique for symptom modulation. ?  ?Updated and instructed pt in HEP: ?Access Code: [email or web address] ?URL: [email or web address] ?Date: 11/03/2021 ?Prepared by: Ricard Dillon ? ?Exercises ?Seated Gaze Stabilization with Head Nod - 1 x daily - 7 x weekly - 3 sets - 1 reps - 60 sec hold ? ? ?  ?Pt educated throughout session about proper posture and technique with exercises. Improved exercise technique, movement at target joints, use of target muscles after min to mod verbal, visual, tactile cues. ? ? ? ? ? ? PT Education - 11/03/21 1517   ? ? Education Details exercise technique, updated HEP   ? Person(s) Educated Patient   ? Methods Explanation;Demonstration;Tactile cues;Verbal cues;Handout   ? Comprehension Verbalized understanding;Returned demonstration;Need further instruction   ? ?  ?  ? ?  ? ? ? PT Short Term Goals - 10/12/21 1539   ? ?  ? PT SHORT TERM GOAL #1  ? Title Pt will be independent with HEP in order to improve strength and balance in order to decrease fall risk and improve function at home and  work.   ? Baseline 2/27: issued on this date   ? Time 6   ? Period Weeks   ? Status New   ? Target Date 11/23/21   ? ?  ?  ? ?  ? ? ? ? PT Long Term Goals - 10/19/21 1121   ? ?  ? PT LONG TERM GOAL #1  ? Title Pt will decrease DHI score by at least 18 points in order to demonstrate clinically significant reduction in disability   ? Baseline 2/27: 48   ? Time 12   ? Period Weeks   ? Status New   ? Target Date 01/04/22   ?  ? PT LONG TERM GOAL #2  ? Title Pt will report at least 50% reduction in symptoms in order to improve function at home and ability to participate in community activities.   ? Baseline 2/27: pt currently limited due  to symptoms   ? Time 12   ? Period Weeks   ? Status New   ? Target Date 01/04/22   ?  ? PT LONG TERM GOAL #3  ? Title Pt will improve DGI by at least 3 points in order to demonstrate clinically significant improvement in balance and decreased risk for falls.   ? Baseline 2/27: to be assessed next 1-2 sessions; 3/6: 17/24   ? Time 12   ? Period Weeks   ? Status New   ? Target Date 01/04/22   ?  ? PT LONG TERM GOAL #4  ? Title Pt will improve ABC by at least 13% in order to demonstrate clinically significant improvement in balance confidence.   ? Baseline 2/27: 41.9%   ? Time 12   ? Period Weeks   ? Status New   ? Target Date 01/04/22   ?  ? PT LONG TERM GOAL #5  ? Title Patient will increase FOTO score to equal to or greater than 57  to demonstrate statistically significant improvement in mobility and quality of life.   ? Baseline 2/27: 52%   ? Time 12   ? Period Weeks   ? Status New   ? Target Date 01/04/22   ? ?  ?  ? ?  ? ? ? ? ? ? ? ? Plan - 11/03/21 1527   ? ? Clinical Impression Statement Pt making gains with reports of progress with symptoms outside of therapy, stating she is able to "do more" and that original HEP is no longer as challenging or symptom provoking. Pt with minimal increases in dizziness throughout appointment, where dizziness reached no more than 2/10. Pt's  dizziness also reduced quickly with rest. Pt still with mobility restrictions and multiple TrPs throughout cervical spine (noted mostly in R occipital musculature). Pt did report improvement in pain with manual ther

## 2021-11-12 ENCOUNTER — Ambulatory Visit: Payer: BC Managed Care – PPO

## 2021-11-12 DIAGNOSIS — R42 Dizziness and giddiness: Secondary | ICD-10-CM

## 2021-11-12 DIAGNOSIS — R2681 Unsteadiness on feet: Secondary | ICD-10-CM

## 2021-11-12 DIAGNOSIS — R2689 Other abnormalities of gait and mobility: Secondary | ICD-10-CM

## 2021-11-12 NOTE — Therapy (Signed)
[place] ?[facility] ?[place], [place], [address] ?Phone: [phone]   Fax:  [phone] ? ?Physical Therapy Treatment ? ?Patient Details  ?Name: Helen Trevino ?MRN: [id number] ?Date of Birth: 10-20-1960 ?Referring Provider (PT): Vladimir Crofts, MD ? ? ?Encounter Date: 11/12/2021 ? ? PT End of Session - 11/12/21 0911   ? ? Visit Number 4   ? Number of Visits 25   ? Date for PT Re-Evaluation 01/04/22   ? PT Start Time 0802   ? PT Stop Time [phone]   ? PT Time Calculation (min) 50 min   ? Equipment Utilized During Treatment Gait belt   ? Activity Tolerance Patient tolerated treatment well   ? Behavior During Therapy [facility] for tasks assessed/performed   ? ?  ?  ? ?  ? ? ?Past Medical History:  ?Diagnosis Date  ? Meniere's disease   ? Seasonal allergies   ? ? ?Past Surgical History:  ?Procedure Laterality Date  ? HAND SURGERY  1983  ? [place]  ? ? ?There were no vitals filed for this visit. ? ? Subjective Assessment - 11/12/21 0806   ? ? Subjective Pt reports continued improvement in her symtpoms and improvement with her hearing or ear symptoms. She does report one recent stumble into a shelf at the grocery store. Oveall pt reports increase in her general activity levels with dereased symptoms.   ? Pertinent History Pt is a pleasant 61 yo female presenting to outpatient PT with c/o dizziness and imbalance. 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 She reports she has had migraines for many years, but that dizziness and unsteadiness has worsened in the past two months. She reports lingering symptoms between migraine attacks. She reports episodes of vertigo that can last 30 min to hours. Pt has been to an ENT, she reports they ruled out meniere's disease (this was several years ago). Pt has not had hearing tested recently.  Her symptoms include: tinnitus, ear ache, nausea, metalic taste in her mouth with migraines, headaches, vertigo, fatigue,  "wax paper" feeling over eyes, light sensitivity and noise sensitivity. She has difficulty with screens, busy stores. She reports pain and tightness felt in her neck, primarily posteriorly. When pt has a headache she feels it primarily on R side and top center of her head. Pt reports she has been diagnosed with a "histamine issue" and is on a "low histamine diet."  Pt reports during migraine episodes she finds it helps best to be still and quiet. She will use heat and ice over her forehead. Pt reports one recent fall on Valentine's day when going to hug her daughter. She reports she fell into her couch. Pt does report history of vestibular rehab, but that it was years ago. Pt wears glasses. Per chart PMH is significant for Intractable migraine with vestibular symptoms, anxiety.   ? Limitations House hold activities;Standing;Walking;Lifting   ? How long can you sit comfortably? not affected   ? How long can you stand comfortably? at times limited d/t unsteadiness/dizziness   ? How long can you walk comfortably? limited due to unsteadiness, dizziness   ? Diagnostic tests no recent pertinent testing   ? Patient Stated Goals Pt would like to manage her symptoms better and "ease back into" daily activities   ? Currently in Pain? No/denies   ? ?  ?  ? ?  ? ? ?  ?INTERVENTIONS -  ? ? ?  Trigger Point Dry Needling (TDN), unbilled ?Education performed with patient regarding potential benefit of TDN. Reviewed precautions and risks with patient. Reviewed special precautions/risks over lung fields which include pneumothorax. Reviewed signs and symptoms of pneumothorax and advised pt to go to ER immediately if these symptoms develop advise them of dry needling treatment. Extensive time spent with pt to ensure full understanding of TDN risks. Pt provided verbal consent to treatment. TDN performed to  with 0.3 x 30 single needle placements with local twitch response (LTR). Pistoning technique utilized. Improved pain-free motion  following intervention. Muscles targeted bilateral cervical paraspinals, suboccipitals, and upper traps. X 5 minutes  ?  ? ?NMR- Gait belt donned and CGA provided throughout unless otherwise specified.  ? ?Over 10 meter track- ?Ambulating with vertical head turns 4x ?Ambulating with horizontal head turns 4x ?Ambulating diagonal head turns -2x most challenging  ? ?Standing VORx1, busy background, horizontal head turns 2x30 sec  ?Standing VORx1, busy background, NBOS, horizontal head turns 2x30 sec ?Comments: increased sway, mild dizziness. ? ?Body rolls 8x at wall. Pt requires brief rest breaks throughout due to increased symptoms. Pt reports this is the most challenging exercise. ? ?Pt standing on airex, at support surface with CGA provided throughout unless otherwise specified: ?NBOS EO 60 sec ?NBOS EC 2x60 sec with and without UE support ?NBOS with vertical and horizontal head turns 60 sec for each. Sway with horizontal, none with vertical. ?Pt rates interventions as easy-medium. ? ?SLB at suport surface 30 sec each LE. Pt able to maintain >20 sec before utilizing UE support each LE.   ? ?Pt takes prolonged seated rest break at end of session for symptom decrease x 6 minutes. PT monitors pt throughout. Pt reports improvement in symptoms with rest and exhibits good postural stability at end of appointment.  ? ? Pt educated throughout session about proper posture and technique with exercises. Improved exercise technique, movement at target joints, use of target muscles after min to mod verbal, visual, tactile cues. ? ? ? ? PT Education - 11/12/21 0911   ? ? Education Details exercise technique, body mechanics   ? Person(s) Educated Patient   ? Methods Explanation;Demonstration;Verbal cues   ? Comprehension Verbalized understanding;Returned demonstration;Verbal cues required;Need further instruction   ? ?  ?  ? ?  ? ? ? PT Short Term Goals - 10/12/21 1539   ? ?  ? PT SHORT TERM GOAL #1  ? Title Pt will be independent  with HEP in order to improve strength and balance in order to decrease fall risk and improve function at home and work.   ? Baseline 2/27: issued on this date   ? Time 6   ? Period Weeks   ? Status New   ? Target Date 11/23/21   ? ?  ?  ? ?  ? ? ? ? PT Long Term Goals - 10/19/21 1121   ? ?  ? PT LONG TERM GOAL #1  ? Title Pt will decrease DHI score by at least 18 points in order to demonstrate clinically significant reduction in disability   ? Baseline 2/27: 48   ? Time 12   ? Period Weeks   ? Status New   ? Target Date 01/04/22   ?  ? PT LONG TERM GOAL #2  ? Title Pt will report at least 50% reduction in symptoms in order to improve function at home and ability to participate in community activities.   ? Baseline 2/27: pt currently  limited due to symptoms   ? Time 12   ? Period Weeks   ? Status New   ? Target Date 01/04/22   ?  ? PT LONG TERM GOAL #3  ? Title Pt will improve DGI by at least 3 points in order to demonstrate clinically significant improvement in balance and decreased risk for falls.   ? Baseline 2/27: to be assessed next 1-2 sessions; 3/6: 17/24   ? Time 12   ? Period Weeks   ? Status New   ? Target Date 01/04/22   ?  ? PT LONG TERM GOAL #4  ? Title Pt will improve ABC by at least 13% in order to demonstrate clinically significant improvement in balance confidence.   ? Baseline 2/27: 41.9%   ? Time 12   ? Period Weeks   ? Status New   ? Target Date 01/04/22   ?  ? PT LONG TERM GOAL #5  ? Title Patient will increase FOTO score to equal to or greater than 57  to demonstrate statistically significant improvement in mobility and quality of life.   ? Baseline 2/27: 52%   ? Time 12   ? Period Weeks   ? Status New   ? Target Date 01/04/22   ? ?  ?  ? ?  ? ? ? ? ? ? ? ? Plan - 11/12/21 0916   ? ? Clinical Impression Statement Pt contuing to make gains with reports of increased activity and decreased symptoms outside of PT. Pt able to advance to VOR with busy background. She is still most symptomatic with use  of vertical head turns and turning exercises (body rolls). The pt will benefit from further skilled PT to improve dizziness, balance and mobility to increase QOL and decrease fall risk.   ? Personal Fa

## 2021-11-17 ENCOUNTER — Ambulatory Visit: Payer: BC Managed Care – PPO

## 2021-11-24 ENCOUNTER — Ambulatory Visit: Payer: BC Managed Care – PPO | Attending: Neurology

## 2021-11-24 DIAGNOSIS — R262 Difficulty in walking, not elsewhere classified: Secondary | ICD-10-CM | POA: Diagnosis present

## 2021-11-24 DIAGNOSIS — R2681 Unsteadiness on feet: Secondary | ICD-10-CM | POA: Diagnosis present

## 2021-11-24 DIAGNOSIS — M542 Cervicalgia: Secondary | ICD-10-CM | POA: Diagnosis present

## 2021-11-24 DIAGNOSIS — R2689 Other abnormalities of gait and mobility: Secondary | ICD-10-CM | POA: Diagnosis present

## 2021-11-24 DIAGNOSIS — R42 Dizziness and giddiness: Secondary | ICD-10-CM | POA: Diagnosis present

## 2021-11-24 DIAGNOSIS — M6281 Muscle weakness (generalized): Secondary | ICD-10-CM | POA: Insufficient documentation

## 2021-11-24 NOTE — Therapy (Signed)
[place] ?[facility] ?[place], [place], [address] ?Phone: [phone]   Fax:  [phone] ? ?Physical Therapy Treatment ? ?Patient Details  ?Name: Helen Trevino ?MRN: [id number] ?Date of Birth: 04-18-1961 ?Referring Provider (PT): Vladimir Crofts, MD ? ? ?Encounter Date: 11/24/2021 ? ? PT End of Session - 11/24/21 1703   ? ? Visit Number 5   ? Number of Visits 25   ? Date for PT Re-Evaluation 01/04/22   ? PT Start Time 1350   ? PT Stop Time 4944   ? PT Time Calculation (min) 39 min   ? Equipment Utilized During Treatment Gait belt   ? Activity Tolerance Patient tolerated treatment well   ? Behavior During Therapy [facility] for tasks assessed/performed   ? ?  ?  ? ?  ? ? ?Past Medical History:  ?Diagnosis Date  ? Meniere's disease   ? Seasonal allergies   ? ? ?Past Surgical History:  ?Procedure Laterality Date  ? HAND SURGERY  1983  ? [place]  ? ? ?There were no vitals filed for this visit. ? ? Subjective Assessment - 11/24/21 1354   ? ? Subjective Pt has audiologist appointment tomorrow. She reports she has been doing fairly well but when she gets headaches she worries she will become dizzy. She still stumbles but doesn't think she stumbles as much.   ? Pertinent History Pt is a pleasant 61 yo female presenting to outpatient PT with c/o dizziness and imbalance. 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 She reports she has had migraines for many years, but that dizziness and unsteadiness has worsened in the past two months. She reports lingering symptoms between migraine attacks. She reports episodes of vertigo that can last 30 min to hours. Pt has been to an ENT, she reports they ruled out meniere's disease (this was several years ago). Pt has not had hearing tested recently.  Her symptoms include: tinnitus, ear ache, nausea, metalic taste in her mouth with migraines, headaches, vertigo, fatigue, "wax paper" feeling over eyes, light  sensitivity and noise sensitivity. She has difficulty with screens, busy stores. She reports pain and tightness felt in her neck, primarily posteriorly. When pt has a headache she feels it primarily on R side and top center of her head. Pt reports she has been diagnosed with a "histamine issue" and is on a "low histamine diet."  Pt reports during migraine episodes she finds it helps best to be still and quiet. She will use heat and ice over her forehead. Pt reports one recent fall on Valentine's day when going to hug her daughter. She reports she fell into her couch. Pt does report history of vestibular rehab, but that it was years ago. Pt wears glasses. Per chart PMH is significant for Intractable migraine with vestibular symptoms, anxiety.   ? Limitations House hold activities;Standing;Walking;Lifting   ? How long can you sit comfortably? not affected   ? How long can you stand comfortably? at times limited d/t unsteadiness/dizziness   ? How long can you walk comfortably? limited due to unsteadiness, dizziness   ? Diagnostic tests no recent pertinent testing   ? Patient Stated Goals Pt would like to manage her symptoms better and "ease back into" daily activities   ? Currently in Pain? No/denies   ? ?  ?  ? ?  ? ?INTERVENTIONS- ? ?Manual: ?Pt hooklying on plinth. Pt provides STM and TrP release to  B cervical paraspinals and muscles of occipital triangle with primary focus on R side, where pt most tender throughout. Additionally, PT takes pt through B UT stretch (2x60 sec each side), and provides gentle cervical traction in 15-20 sec bouts. Pt reports greatest improvement in pain with cervical traction and TrP release.  ? ?TherEx- ?[employer] ?2.5# Rows 10x. Pt rates easy ?7.5# 2x10. Pt rates moderate. ?Seated RTB shoulder ER B - 15x2 sets ? ?NMR: ?Standing, NBOS, VORx1 with horizontal head turns, busy background 2x30 sec ?Standing, NBOS, VORx1 with vertical head turns, busy background 2x30 sec ?Pt takes  brief rest intervals between sets. Dizziness reaches no greater than 1/10 throughout. No LOB. Mild unsteadiness following first rep, where pt able to correct independently.  ? ?Over 10 meters: ?Tandem walking 1x1. Challenging. Pt exhibits step-strategy. Must correct to a modified tandem.  ? ?At support surface: ?Tandem stance 2x30 sec each LE ? ? ?Pt reports no dizziness at end of session  ? ? ?PT adds to pt's HEP and instructs pt in HEP. Pt able to return correct demo within session: ?Access Code: [email or web address] ?URL: [email or web address] ?Date: 11/24/2021 ?Prepared by: Ricard Dillon ? ?Exercises ?- Shoulder External Rotation and Scapular Retraction with Resistance  - 1 x daily - 5 x weekly - 3 sets - 10 reps ?- Seated Shoulder Row with Resistance Anchored at Feet  - 1 x daily - 5 x weekly - 3 sets - 10 reps ?  ? Pt educated throughout session about proper posture and technique with exercises. Improved exercise technique, movement at target joints, use of target muscles after min to mod verbal, visual, tactile cues. ? ? ? PT Education - 11/24/21 1704   ? ? Education Details exercise technique   ? Person(s) Educated Patient   ? Methods Explanation;Demonstration;Verbal cues;Tactile cues   ? Comprehension Verbalized understanding;Returned demonstration;Need further instruction   ? ?  ?  ? ?  ? ? ? PT Short Term Goals - 10/12/21 1539   ? ?  ? PT SHORT TERM GOAL #1  ? Title Pt will be independent with HEP in order to improve strength and balance in order to decrease fall risk and improve function at home and work.   ? Baseline 2/27: issued on this date   ? Time 6   ? Period Weeks   ? Status New   ? Target Date 11/23/21   ? ?  ?  ? ?  ? ? ? ? PT Long Term Goals - 10/19/21 1121   ? ?  ? PT LONG TERM GOAL #1  ? Title Pt will decrease DHI score by at least 18 points in order to demonstrate clinically significant reduction in disability   ? Baseline 2/27: 48   ? Time 12   ? Period Weeks   ? Status New   ? Target Date  01/04/22   ?  ? PT LONG TERM GOAL #2  ? Title Pt will report at least 50% reduction in symptoms in order to improve function at home and ability to participate in community activities.   ? Baseline 2/27: pt currently limited due to symptoms   ? Time 12   ? Period Weeks   ? Status New   ? Target Date 01/04/22   ?  ? PT LONG TERM GOAL #3  ? Title Pt will improve DGI by at least 3 points in order to demonstrate clinically significant improvement in balance and decreased risk for falls.   ?  Baseline 2/27: to be assessed next 1-2 sessions; 3/6: 17/24   ? Time 12   ? Period Weeks   ? Status New   ? Target Date 01/04/22   ?  ? PT LONG TERM GOAL #4  ? Title Pt will improve ABC by at least 13% in order to demonstrate clinically significant improvement in balance confidence.   ? Baseline 2/27: 41.9%   ? Time 12   ? Period Weeks   ? Status New   ? Target Date 01/04/22   ?  ? PT LONG TERM GOAL #5  ? Title Patient will increase FOTO score to equal to or greater than 57  to demonstrate statistically significant improvement in mobility and quality of life.   ? Baseline 2/27: 52%   ? Time 12   ? Period Weeks   ? Status New   ? Target Date 01/04/22   ? ?  ?  ? ?  ? ? ? ? ? ? ? ? Plan - 11/24/21 1712   ? ? Clinical Impression Statement Pt making gains with dizziness reaching no greater than 1/10 on this date while performing VOR with busy background in a challenging stance, NBOS. While pt making gains she continues to be TTP throughout cervical musculature, particularly on R side. However, she does report stiffness feelings in neck have improved. PT added shoulder strengthening to pt's HEP to address these deficits and previously found impairment in strength in this region. The pt will continue to benefit from skilled PT to improve dizziness symptoms, balance, strength, and mobility to increase QOL and decrease fall risk.   ? Personal Factors and Comorbidities Age;Time since onset of  injury/illness/exacerbation;Sex;Fitness;Comorbidity 2   ? Comorbidities anxiety, Intractable migraine with vestibular symptoms   ? Examination-Activity Limitations Bend;Lift;Stand;Locomotion Level;Stairs;Reach Overhead;Bed Mobility   ? Examination-Participa

## 2021-12-01 ENCOUNTER — Ambulatory Visit: Payer: BC Managed Care – PPO

## 2021-12-01 DIAGNOSIS — R42 Dizziness and giddiness: Secondary | ICD-10-CM | POA: Diagnosis not present

## 2021-12-01 DIAGNOSIS — R2689 Other abnormalities of gait and mobility: Secondary | ICD-10-CM

## 2021-12-01 DIAGNOSIS — R2681 Unsteadiness on feet: Secondary | ICD-10-CM

## 2021-12-01 DIAGNOSIS — M542 Cervicalgia: Secondary | ICD-10-CM

## 2021-12-01 DIAGNOSIS — M6281 Muscle weakness (generalized): Secondary | ICD-10-CM

## 2021-12-01 NOTE — Therapy (Signed)
?[facility] ?[place], [place], [address] ?Phone: [phone]   Fax:  [phone] ? ?Physical Therapy Treatment ? ?Patient Details  ?Name: Helen Trevino ?MRN: [id number] ?Date of Birth: May 04, 1961 ?Referring Provider (PT): Vladimir Crofts, MD ? ? ?Encounter Date: 12/01/2021 ? ? PT End of Session - 12/01/21 1400   ? ? Visit Number 6   ? Number of Visits 25   ? Date for PT Re-Evaluation 01/04/22   ? Authorization Type [employer] COMM Pro- 30VL per calendar year (PT/chiropractic)   ? Authorization Time Period 10/12/21-01/04/22   ? Progress Note Due on Visit 10   ? PT Start Time 1350   ? PT Stop Time 1430   ? PT Time Calculation (min) 40 min   ? Activity Tolerance Patient tolerated treatment well;No increased pain   ? Behavior During Therapy [facility] for tasks assessed/performed   ? ?  ?  ? ?  ? ? ?Past Medical History:  ?Diagnosis Date  ? Meniere's disease   ? Seasonal allergies   ? ? ?Past Surgical History:  ?Procedure Laterality Date  ? HAND SURGERY  1983  ? [place]  ? ? ?There were no vitals filed for this visit. ? ? Subjective Assessment - 12/01/21 1353   ? ? Subjective Pt doing well, she was able to work on HEP a bit more with success. She had some headache days that limited her ability to work on her exercises.   ? Currently in Pain? No/denies   jus tmild soreness, stiffness, not what she would call pain  ? ?  ?  ? ?  ? ? ? ? ? ? PT Education - 12/01/21 1410   ? ? Education Details technique for C0/C! mobilitzaton   ? Person(s) Educated Patient   ? Methods Explanation;Demonstration   ? Comprehension Verbalized understanding   ? ?  ?  ? ?  ? ? ? PT Short Term Goals - 10/12/21 1539   ? ?  ? PT SHORT TERM GOAL #1  ? Title Pt will be independent with HEP in order to improve strength and balance in order to decrease fall risk and improve function at home and work.   ? Baseline 2/27: issued on this date   ? Time 6   ? Period Weeks   ? Status New    ? Target Date 11/23/21   ? ?  ?  ? ?  ? ? ? ? PT Long Term Goals - 10/19/21 1121   ? ?  ? PT LONG TERM GOAL #1  ? Title Pt will decrease DHI score by at least 18 points in order to demonstrate clinically significant reduction in disability   ? Baseline 2/27: 48   ? Time 12   ? Period Weeks   ? Status New   ? Target Date 01/04/22   ?  ? PT LONG TERM GOAL #2  ? Title Pt will report at least 50% reduction in symptoms in order to improve function at home and ability to participate in community activities.   ? Baseline 2/27: pt currently limited due to symptoms   ? Time 12   ? Period Weeks   ? Status New   ? Target Date 01/04/22   ?  ? PT LONG TERM GOAL #3  ? Title Pt will improve DGI by at least 3 points in order to demonstrate clinically significant improvement in balance and decreased risk  for falls.   ? Baseline 2/27: to be assessed next 1-2 sessions; 3/6: 17/24   ? Time 12   ? Period Weeks   ? Status New   ? Target Date 01/04/22   ?  ? PT LONG TERM GOAL #4  ? Title Pt will improve ABC by at least 13% in order to demonstrate clinically significant improvement in balance confidence.   ? Baseline 2/27: 41.9%   ? Time 12   ? Period Weeks   ? Status New   ? Target Date 01/04/22   ?  ? PT LONG TERM GOAL #5  ? Title Patient will increase FOTO score to equal to or greater than 57  to demonstrate statistically significant improvement in mobility and quality of life.   ? Baseline 2/27: 52%   ? Time 12   ? Period Weeks   ? Status New   ? Target Date 01/04/22   ? ?  ?  ? ?  ? ?INTERVENTION THIS DATE:  ?-Standing cable row 12.5lb 1x10 ?-balance beam walking hands prn 4RT in // bars  ?-VOR training 2x30sec simple background, 2x30 c busy medical curtain ?-C0/C1 self mobilization 2x20 nods bilat ?-EC NBOS airex pad 2x30sec ?-education on bilat UT ART to perform with husband at home  ?-standing rainbow ball self toss/catch x20, overhead wall rebound x20  ?*breaks and hertz adjusted to maximize vestibular response  subjectively ? ? ? ? ? ? Plan - 12/01/21 1411   ? ? Clinical Impression Statement Continued with scapulothoracic rotation strengthening and balance training. Mild provocation of symptoms well managed in session. Manual therapies targeted at cervical muscles holding strong since last session. Pt progressing well toward goals overall.   ? Personal Factors and Comorbidities Age;Time since onset of injury/illness/exacerbation;Sex;Fitness;Comorbidity 2   ? Comorbidities anxiety, Intractable migraine with vestibular symptoms   ? Examination-Activity Limitations Bend;Lift;Stand;Locomotion Level;Stairs;Reach Overhead;Bed Mobility   ? Examination-Participation Restrictions Volunteer;Community Activity;Shop;Yard Work;Cleaning;Driving;Laundry;Meal Prep   ? Stability/Clinical Decision Making Evolving/Moderate complexity   ? Clinical Decision Making Moderate   ? Rehab Potential Good   ? PT Frequency 2x / week   ? PT Duration 12 weeks   ? PT Treatment/Interventions ADLs/Self Care Home Management;Biofeedback;Canalith Repostioning;Cryotherapy;Electrical Stimulation;Moist Heat;Traction;Ultrasound;DME Instruction;Gait Scientist, forensic;Therapeutic exercise;Therapeutic activities;Functional mobility training;Balance training;Neuromuscular re-education;Patient/family education;Orthotic Fit/Training;Wheelchair mobility training;Manual techniques;Passive range of motion;Dry needling;Energy conservation;Taping;Vestibular;Visual/perceptual remediation/compensation;Joint Manipulations;Spinal Manipulations;Parrafin   ? PT Home Exercise Plan Access Code: [email or web address]; Access Code: [email or web address]; 3/21: Access Code: [email or web address]; 4/11: Access Code: [email or web address]   ? Consulted and Agree with Plan of Care Patient   ? ?  ?  ? ?  ? ? ?Patient will benefit from skilled therapeutic intervention in order to improve the following deficits and impairments:  Abnormal gait, Decreased balance, Decreased mobility, Dizziness, Decreased strength, Decreased activity  tolerance, Decreased range of motion, Hypomobility, Impaired sensation, Improper body mechanics, Pain, Difficulty walking, Impaired flexibility, Increased muscle spasms, Impaired vision/preception ? ?Visit Diagnosis: ?Dizziness and giddiness ? ?Cervicalgia ? ?Unsteadiness on feet ? ?Muscle weakness (generalized) ? ?Other abnormalities of gait and mobility ? ? ? ? ?Problem List ?There are no problems to display for this patient. ? ?2:31 PM, 12/01/21 ?Etta Grandchild, PT, DPT ?Physical Therapist - [place] ?[facility]  ?Outpatient Physical Therapy- [employer] ?[phone]   ? ? ?Savon Bordonaro C, PT ?12/01/2021, 2:15 PM ? ?[place] ?[facility] ?[place], [place], [address] ?Phone: [phone]   Fax:  [phone] ? ?Name: CASSANDR CEDERBERG ?MRN:  [id number] ?Date of Birth: May 17, 1961 ? ? ? ?

## 2021-12-08 ENCOUNTER — Ambulatory Visit: Payer: BC Managed Care – PPO

## 2021-12-08 DIAGNOSIS — M542 Cervicalgia: Secondary | ICD-10-CM

## 2021-12-08 DIAGNOSIS — R2681 Unsteadiness on feet: Secondary | ICD-10-CM

## 2021-12-08 DIAGNOSIS — M6281 Muscle weakness (generalized): Secondary | ICD-10-CM

## 2021-12-08 DIAGNOSIS — R42 Dizziness and giddiness: Secondary | ICD-10-CM

## 2021-12-08 DIAGNOSIS — R262 Difficulty in walking, not elsewhere classified: Secondary | ICD-10-CM

## 2021-12-08 NOTE — Therapy (Signed)
[facility] ?[place], [place], [address] ?Phone: [phone]   Fax:  [phone] ? ?Physical Therapy Treatment ? ?Patient Details  ?Name: Helen Trevino ?MRN: [id number] ?Date of Birth: 13-Jul-1961 ?Referring Provider (PT): Vladimir Crofts, MD ? ? ?Encounter Date: 12/08/2021 ? ? PT End of Session - 12/08/21 1705   ? ? Visit Number 7   ? Number of Visits 25   ? Date for PT Re-Evaluation 01/04/22   ? Authorization Type [employer] COMM Pro- 30VL per calendar year (PT/chiropractic)   ? Authorization Time Period 10/12/21-01/04/22   ? Progress Note Due on Visit 10   ? PT Start Time 1106   ? PT Stop Time 1145   ? PT Time Calculation (min) 39 min   ? Equipment Utilized During Treatment Gait belt   ? Activity Tolerance Patient tolerated treatment well;No increased pain   ? Behavior During Therapy [facility] for tasks assessed/performed   ? ?  ?  ? ?  ? ? ?Past Medical History:  ?Diagnosis Date  ? Meniere's disease   ? Seasonal allergies   ? ? ?Past Surgical History:  ?Procedure Laterality Date  ? HAND SURGERY  1983  ? [place]  ? ? ?There were no vitals filed for this visit. ? ? Subjective Assessment - 12/08/21 1107   ? ? Subjective Pt currently has a headache. She feels her head is "a little heavy." Pt has been spending a lot of time outside. She reports she has been working through her dizziness. She reports she has not had full-blown symptoms.   ? Pertinent History Pt is a pleasant 61 yo female presenting to outpatient PT with c/o dizziness and imbalance. 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 She reports she has had migraines for many years, but that dizziness and unsteadiness has worsened in the past two months. She reports lingering symptoms between migraine attacks. She reports episodes of vertigo that can last 30 min to hours. Pt has been to an ENT, she reports they ruled out meniere's disease (this was several years ago). Pt has not had  hearing tested recently.  Her symptoms include: tinnitus, ear ache, nausea, metalic taste in her mouth with migraines, headaches, vertigo, fatigue, "wax paper" feeling over eyes, light sensitivity and noise sensitivity. She has difficulty with screens, busy stores. She reports pain and tightness felt in her neck, primarily posteriorly. When pt has a headache she feels it primarily on R side and top center of her head. Pt reports she has been diagnosed with a "histamine issue" and is on a "low histamine diet."  Pt reports during migraine episodes she finds it helps best to be still and quiet. She will use heat and ice over her forehead. Pt reports one recent fall on Valentine's day when going to hug her daughter. She reports she fell into her couch. Pt does report history of vestibular rehab, but that it was years ago. Pt wears glasses. Per chart PMH is significant for Intractable migraine with vestibular symptoms, anxiety.   ? Limitations House hold activities;Standing;Walking;Lifting   ? How long can you sit comfortably? not affected   ? How long can you stand comfortably? at times limited d/t unsteadiness/dizziness   ? How long can you walk comfortably? limited due to unsteadiness, dizziness   ? Diagnostic tests no recent pertinent testing   ? Patient Stated Goals Pt would like to manage her symptoms better and "ease  back into" daily activities   ? Currently in Pain? Yes   headache (not new)  ? ?  ?  ? ?  ? ? ? ?INTERVENTIONS- ?  ?Manual: ?Pt hooklying on plinth (bolster support) Pt provides STM and TrP release to B cervical paraspinals and muscles of occipital triangle with primary focus on R side, where pt still most tender throughout, TrPs also noted throughout lower L cervical paraspinals. Additionally, PT takes pt through B levator stretch (several minutes each side for long duration stretch), and provides gentle cervical traction with cervical spine in neutral in 15-20 sec bouts x 5 reps. Pt also performs 10  chin tucks with 1-2 sec holds.  ?PT brings pt into cervical flexors stretch x 60 sec. ? ?NMR: ?Standing NBOS, VORx1 busy background, vertical and horizontal head turns 30 sec each. Recovery intervals as pt reports 3/10 dizziness. Exhibits increased sway. CGA provided. ? ?Instruction in box breathing technique for recovery interval  ?  ?Standing NBOS, VORx1, plain background 2 x 30 sec of each of the following: vertical and horizonal head turns. Cuing to keep eyes open as pt tendency to close eyes. ? ?Instructed pt in HEP update including within session demonstration and instruction of optokinetic stimulation. ?Optokinetic stimulation: vertical bars moving to the R side. Also provided this link to pt's email (secured) and instructed pt to complete as much of the 3 minute video as possible at home, as long as dizziness level does not reach >3/10. Pt verbalized understanding and demo'd correct technique in session, where she was able to tolerate 30 sec viewing of video. ?Dizziness level reaches 1-2/10.  ?Pt takes recovery interval.  ? ? ?TherEx- ?[employer] ?7.5# 1x10. Pt rates moderate. ?12.5# 2x10-12. Medium ? ? ?Pt educated throughout session about proper posture and technique with exercises. Improved exercise technique, movement at target joints, use of target muscles after min to mod verbal, visual, tactile cues. ? ? ? PT Education - 12/08/21 1705   ? ? Education Details updated HEP to include 3 min optokinetic stimulation video   ? Person(s) Educated Patient   ? Methods Explanation;Demonstration;Verbal cues;Handout   ? Comprehension Verbalized understanding;Returned demonstration;Need further instruction   ? ?  ?  ? ?  ? ? ? PT Short Term Goals - 10/12/21 1539   ? ?  ? PT SHORT TERM GOAL #1  ? Title Pt will be independent with HEP in order to improve strength and balance in order to decrease fall risk and improve function at home and work.   ? Baseline 2/27: issued on this date   ? Time 6   ? Period  Weeks   ? Status New   ? Target Date 11/23/21   ? ?  ?  ? ?  ? ? ? ? PT Long Term Goals - 10/19/21 1121   ? ?  ? PT LONG TERM GOAL #1  ? Title Pt will decrease DHI score by at least 18 points in order to demonstrate clinically significant reduction in disability   ? Baseline 2/27: 48   ? Time 12   ? Period Weeks   ? Status New   ? Target Date 01/04/22   ?  ? PT LONG TERM GOAL #2  ? Title Pt will report at least 50% reduction in symptoms in order to improve function at home and ability to participate in community activities.   ? Baseline 2/27: pt currently limited due to symptoms   ? Time 12   ?  Period Weeks   ? Status New   ? Target Date 01/04/22   ?  ? PT LONG TERM GOAL #3  ? Title Pt will improve DGI by at least 3 points in order to demonstrate clinically significant improvement in balance and decreased risk for falls.   ? Baseline 2/27: to be assessed next 1-2 sessions; 3/6: 17/24   ? Time 12   ? Period Weeks   ? Status New   ? Target Date 01/04/22   ?  ? PT LONG TERM GOAL #4  ? Title Pt will improve ABC by at least 13% in order to demonstrate clinically significant improvement in balance confidence.   ? Baseline 2/27: 41.9%   ? Time 12   ? Period Weeks   ? Status New   ? Target Date 01/04/22   ?  ? PT LONG TERM GOAL #5  ? Title Patient will increase FOTO score to equal to or greater than 57  to demonstrate statistically significant improvement in mobility and quality of life.   ? Baseline 2/27: 52%   ? Time 12   ? Period Weeks   ? Status New   ? Target Date 01/04/22   ? ?  ?  ? ?  ? ? ? ? ? ? ? ? Plan - 12/08/21 1706   ? ? Clinical Impression Statement Pt highly motivated to participate in session. She reports functional carryover outside of PT, reporting slight improvement in activity tolerance/symptoms. Pt dizziness reached no greater than 3/10 in session, where it was most provoked by VOR with busy background. Additionally, PT introduced optokinetic stimulation training (see note for details). The pt will  benefit from further skilled PT to improve dizziness symptoms and balance to increase QOL and decrease fall risk.   ? Personal Factors and Comorbidities Age;Time since onset of injury/illness/exacerbation;Sex

## 2021-12-15 ENCOUNTER — Ambulatory Visit: Payer: BC Managed Care – PPO | Attending: Neurology

## 2021-12-15 DIAGNOSIS — R2681 Unsteadiness on feet: Secondary | ICD-10-CM | POA: Diagnosis present

## 2021-12-15 DIAGNOSIS — M542 Cervicalgia: Secondary | ICD-10-CM | POA: Insufficient documentation

## 2021-12-15 DIAGNOSIS — R42 Dizziness and giddiness: Secondary | ICD-10-CM | POA: Insufficient documentation

## 2021-12-15 DIAGNOSIS — M6281 Muscle weakness (generalized): Secondary | ICD-10-CM | POA: Diagnosis present

## 2021-12-15 NOTE — Therapy (Signed)
[facility] ?[place], [place], [address] ?Phone: [phone]   Fax:  [phone] ? ?Physical Therapy Treatment ? ?Patient Details  ?Name: Helen Trevino ?MRN: [id number] ?Date of Birth: Apr 28, 1961 ?Referring Provider (PT): Vladimir Crofts, MD ? ? ?Encounter Date: 12/15/2021 ? ? PT End of Session - 12/15/21 1406   ? ? Visit Number 8   ? Number of Visits 25   ? Date for PT Re-Evaluation 01/04/22   ? Authorization Type [employer] COMM Pro- 30VL per calendar year (PT/chiropractic)   ? Authorization Time Period 10/12/21-01/04/22   ? Progress Note Due on Visit 10   ? PT Start Time 1350   ? PT Stop Time 0539   ? PT Time Calculation (min) 39 min   ? Equipment Utilized During Treatment Gait belt   ? Activity Tolerance Patient tolerated treatment well;No increased pain   ? Behavior During Therapy [facility] for tasks assessed/performed   ? ?  ?  ? ?  ? ? ?Past Medical History:  ?Diagnosis Date  ? Meniere's disease   ? Seasonal allergies   ? ? ?Past Surgical History:  ?Procedure Laterality Date  ? HAND SURGERY  1983  ? [place]  ? ? ?There were no vitals filed for this visit. ? ? Subjective Assessment - 12/15/21 1350   ? ? Subjective Pt reports she was traveling last week. Pt reports she has no dizziness currently. The ringing in her ears has not returned.  Pt took some advil when she was traveling due to HA and she thinks it might have been related to the weather. Pt reports she felt pretty good over the weekend. Pt has been performing HEP.   ? Pertinent History Pt is a pleasant 61 yo female presenting to outpatient PT with c/o dizziness and imbalance. 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 She reports she has had migraines for many years, but that dizziness and unsteadiness has worsened in the past two months. She reports lingering symptoms between migraine attacks. She reports episodes of vertigo that can last 30 min to hours. Pt has been to an  ENT, she reports they ruled out meniere's disease (this was several years ago). Pt has not had hearing tested recently.  Her symptoms include: tinnitus, ear ache, nausea, metalic taste in her mouth with migraines, headaches, vertigo, fatigue, "wax paper" feeling over eyes, light sensitivity and noise sensitivity. She has difficulty with screens, busy stores. She reports pain and tightness felt in her neck, primarily posteriorly. When pt has a headache she feels it primarily on R side and top center of her head. Pt reports she has been diagnosed with a "histamine issue" and is on a "low histamine diet."  Pt reports during migraine episodes she finds it helps best to be still and quiet. She will use heat and ice over her forehead. Pt reports one recent fall on Valentine's day when going to hug her daughter. She reports she fell into her couch. Pt does report history of vestibular rehab, but that it was years ago. Pt wears glasses. Per chart PMH is significant for Intractable migraine with vestibular symptoms, anxiety.   ? Limitations House hold activities;Standing;Walking;Lifting   ? How long can you sit comfortably? not affected   ? How long can you stand comfortably? at times limited d/t unsteadiness/dizziness   ? How long can you walk comfortably? limited due to unsteadiness, dizziness   ? Diagnostic tests  no recent pertinent testing   ? Patient Stated Goals Pt would like to manage her symptoms better and "ease back into" daily activities   ? Currently in Pain? No/denies   ? ?  ?  ? ?  ? ? ? ?INTERVENTIONS - Gait belt donned and CGA provided unless otherwise noted ? ?NMR: ? ?SLB 90 sec each LE  ? ?Slow high knee marching gait 1x10 meters ? ?Tandem gait 1x 10 meters, significantly improved no stumbling/LOB ? ?Visual tracking with multicolored ball - PT passing bal behind pt so pt must turn to pass to PT 1x10 meters. Pt reports only mild increase in symptoms that resolve quickly. ? ?Seated visual tracking of  multi-colored ball 20x, horizontal arc. Pt reports needing rest recovery interval.  ?Cuing for box breathing technique for recovery interval  ? ?Ambulating with bending to pick up cones, handing them to PT, then placing them back on floor 2x through -  no dizziness with intervention. Pt reports this as surprising since this movement usually brings on her symptoms.  ? ?Optokinetic stimulation vertical bars moving to Rt 1x 12 seconds, 1x 18 seconds ?Pt reports dizziness reaches 1/10.  ? ?Seated chin tucks 10x with 3 second holds. Pt provides TC. ? ?Optokinetic stimulation vertical bars to Rt 1x 23 seconds  ?Pt reports dizziness reaches 1/10.  ? ?TherEx- ?[employer] ?Rows- ?12.5# 1x10 Rated easy ?17.5# 2x10 rates medium ?2.5# shoulder IR 10x B ? ?Shoulder circles CW/CC 10x ? ?UT stretch 2x20-30 sec each side  ? ?Stretch into cervical flexion 60 sec ? ?Pt educated throughout session about proper posture and technique with exercises. Improved exercise technique, movement at target joints, use of target muscles after min to mod verbal, visual, tactile cues. ? ? ? ? ? ? PT Education - 12/16/21 0817   ? ? Education Details symptom modulation techniques (rest, breathing, progressive viewing of triggering stimuli stopping if symptoms >2/10).   ? Person(s) Educated Patient   ? Methods Explanation;Verbal cues   ? Comprehension Verbalized understanding;Returned demonstration   ? ?  ?  ? ?  ? ? ? PT Short Term Goals - 10/12/21 1539   ? ?  ? PT SHORT TERM GOAL #1  ? Title Pt will be independent with HEP in order to improve strength and balance in order to decrease fall risk and improve function at home and work.   ? Baseline 2/27: issued on this date   ? Time 6   ? Period Weeks   ? Status New   ? Target Date 11/23/21   ? ?  ?  ? ?  ? ? ? ? PT Long Term Goals - 10/19/21 1121   ? ?  ? PT LONG TERM GOAL #1  ? Title Pt will decrease DHI score by at least 18 points in order to demonstrate clinically significant reduction in  disability   ? Baseline 2/27: 48   ? Time 12   ? Period Weeks   ? Status New   ? Target Date 01/04/22   ?  ? PT LONG TERM GOAL #2  ? Title Pt will report at least 50% reduction in symptoms in order to improve function at home and ability to participate in community activities.   ? Baseline 2/27: pt currently limited due to symptoms   ? Time 12   ? Period Weeks   ? Status New   ? Target Date 01/04/22   ?  ? PT LONG TERM GOAL #3  ?  Title Pt will improve DGI by at least 3 points in order to demonstrate clinically significant improvement in balance and decreased risk for falls.   ? Baseline 2/27: to be assessed next 1-2 sessions; 3/6: 17/24   ? Time 12   ? Period Weeks   ? Status New   ? Target Date 01/04/22   ?  ? PT LONG TERM GOAL #4  ? Title Pt will improve ABC by at least 13% in order to demonstrate clinically significant improvement in balance confidence.   ? Baseline 2/27: 41.9%   ? Time 12   ? Period Weeks   ? Status New   ? Target Date 01/04/22   ?  ? PT LONG TERM GOAL #5  ? Title Patient will increase FOTO score to equal to or greater than 57  to demonstrate statistically significant improvement in mobility and quality of life.   ? Baseline 2/27: 52%   ? Time 12   ? Period Weeks   ? Status New   ? Target Date 01/04/22   ? ?  ?  ? ?  ? ? ? ? ? ? ? ? Plan - 12/16/21 4680   ? ? Clinical Impression Statement Pt shows progress with no symptoms with bending to pick up cones intervention, where pt reported she was surprised as this movement usually brings on her symptoms. Dizziness response remained relatively low throughout session (no greater than 1/10). Pt still most challenged with optokinetic intervention. The pt will benefit from further skilled PT to continue to improve dizziness symptoms and balance to increase QOL and decrease fall risk.   ? Personal Factors and Comorbidities Age;Time since onset of injury/illness/exacerbation;Sex;Fitness;Comorbidity 2   ? Comorbidities anxiety, Intractable migraine with  vestibular symptoms   ? Examination-Activity Limitations Bend;Lift;Stand;Locomotion Level;Stairs;Reach Overhead;Bed Mobility   ? Examination-Participation Restrictions Volunteer;Community Activity;Shop;Yard Work;[place]

## 2021-12-21 ENCOUNTER — Ambulatory Visit: Payer: BC Managed Care – PPO

## 2021-12-21 DIAGNOSIS — R2681 Unsteadiness on feet: Secondary | ICD-10-CM

## 2021-12-21 DIAGNOSIS — M6281 Muscle weakness (generalized): Secondary | ICD-10-CM

## 2021-12-21 DIAGNOSIS — R42 Dizziness and giddiness: Secondary | ICD-10-CM | POA: Diagnosis not present

## 2021-12-21 DIAGNOSIS — M542 Cervicalgia: Secondary | ICD-10-CM

## 2021-12-21 NOTE — Therapy (Signed)
[facility] ?[place], [place], [address] ?Phone: [phone]   Fax:  [phone] ? ?Physical Therapy Treatment ? ?Patient Details  ?Name: Helen Trevino ?MRN: [id number] ?Date of Birth: Nov 09, 1960 ?Referring Provider (PT): Vladimir Crofts, MD ? ? ?Encounter Date: 12/21/2021 ? ? PT End of Session - 12/21/21 0818   ? ? Visit Number 9   ? Number of Visits 25   ? Date for PT Re-Evaluation 01/04/22   ? Authorization Type BCBS COMM Pro- 30VL per calendar year (PT/chiropractic)   ? Authorization Time Period 10/12/21-01/04/22   ? Progress Note Due on Visit 10   ? PT Start Time [phone]   ? PT Stop Time 0845   ? PT Time Calculation (min) 39 min   ? Equipment Utilized During Treatment Gait belt   ? Activity Tolerance Patient tolerated treatment well;No increased pain   ? Behavior During Therapy [facility] for tasks assessed/performed   ? ?  ?  ? ?  ? ? ?Past Medical History:  ?Diagnosis Date  ? Meniere's disease   ? Seasonal allergies   ? ? ?Past Surgical History:  ?Procedure Laterality Date  ? HAND SURGERY  1983  ? [place]  ? ? ?There were no vitals filed for this visit. ? ? Subjective Assessment - 12/21/21 0807   ? ? Subjective Pt reports no dizziness currently. She reports no pressure, no ringing in her ears. She feels she is having her pre-symptoms today. Pt reports no pains currently. Pt reports she has reached 30 seconds with optokinetic stimulation intervention and that it went ok.   ? Pertinent History Pt is a pleasant 61 yo female presenting to outpatient PT with c/o dizziness and imbalance. 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 She reports she has had migraines for many years, but that dizziness and unsteadiness has worsened in the past two months. She reports lingering symptoms between migraine attacks. She reports episodes of vertigo that can last 30 min to hours. Pt has been to an ENT, she reports they ruled out meniere's disease  (this was several years ago). Pt has not had hearing tested recently.  Her symptoms include: tinnitus, ear ache, nausea, metalic taste in her mouth with migraines, headaches, vertigo, fatigue, "wax paper" feeling over eyes, light sensitivity and noise sensitivity. She has difficulty with screens, busy stores. She reports pain and tightness felt in her neck, primarily posteriorly. When pt has a headache she feels it primarily on R side and top center of her head. Pt reports she has been diagnosed with a "histamine issue" and is on a "low histamine diet."  Pt reports during migraine episodes she finds it helps best to be still and quiet. She will use heat and ice over her forehead. Pt reports one recent fall on Valentine's day when going to hug her daughter. She reports she fell into her couch. Pt does report history of vestibular rehab, but that it was years ago. Pt wears glasses. Per chart PMH is significant for Intractable migraine with vestibular symptoms, anxiety.   ? Limitations House hold activities;Standing;Walking;Lifting   ? How long can you sit comfortably? not affected   ? How long can you stand comfortably? at times limited d/t unsteadiness/dizziness   ? How long can you walk comfortably? limited due to unsteadiness, dizziness   ? Diagnostic tests no recent pertinent testing   ? Patient Stated Goals Pt would like to manage her  symptoms better and "ease back into" daily activities   ? Currently in Pain? No/denies   ? ?  ?  ? ?  ? ? ? ?INTERVENTIONS - Gait belt donned and CGA provided unless otherwise noted ?  ? ?TherEx: ? ?UT stretch 60 sec each side ?Cervical rotation stretch 60 sec B ?Stretch into cervical flexion 60 sec - feel like she's swaying ?Stretch into cervical extension 60 sec ? ?Chin tucks (seated) 10x 3 sec holds ? ?TherEx- ?[employer] ?Rows- ?12.5# 1x10 Rated easy ?17.5# 2x10 rates medium ? ?BTB shoulder ER pull-aparts 3x10. Performed between sets of NMR to allow for symptom  decrease (below) ? ?NMR: ? ?SLB with with head turns - UE support by TRX, horizontal head turns 10x each direction in each LE position. Dizziness reaches 1/10. Intermittent toe touch. ? ?Tandem walking 1x through. Reports dizziness is 1.5-2/10 ? ?Manual: supine on plinth ?Gentle manual cervical traction 4x20 sec bouts and Trp release to muscles of occipital triangle. Most TTP through R cervical occipital musculature. X 5 min ? ? ?Instructed pt in ? ?Access Code: [email or web address] ?URL: [email or web address] ?Date: 12/21/2021 ?Prepared by: Ricard Dillon ? ?Exercises ?- Corner Balance Feet Apart: Eyes Closed With Head Turns  - 1 x daily - 7 x weekly - 2 sets - 10 reps ? ?Pt educated throughout session about proper posture and technique with exercises. Improved exercise technique, movement at target joints, use of target muscles after min to mod verbal, visual, tactile cues. ? ? ? ? ? PT Education - 12/21/21 0817   ? ? Education Details exercise technique   ? Person(s) Educated Patient   ? Methods Explanation;Demonstration;Verbal cues   ? Comprehension Verbalized understanding;Returned demonstration;Need further instruction   ? ?  ?  ? ?  ? ? ? PT Short Term Goals - 10/12/21 1539   ? ?  ? PT SHORT TERM GOAL #1  ? Title Pt will be independent with HEP in order to improve strength and balance in order to decrease fall risk and improve function at home and work.   ? Baseline 2/27: issued on this date   ? Time 6   ? Period Weeks   ? Status New   ? Target Date 11/23/21   ? ?  ?  ? ?  ? ? ? ? PT Long Term Goals - 10/19/21 1121   ? ?  ? PT LONG TERM GOAL #1  ? Title Pt will decrease DHI score by at least 18 points in order to demonstrate clinically significant reduction in disability   ? Baseline 2/27: 48   ? Time 12   ? Period Weeks   ? Status New   ? Target Date 01/04/22   ?  ? PT LONG TERM GOAL #2  ? Title Pt will report at least 50% reduction in symptoms in order to improve function at home and ability to participate  in community activities.   ? Baseline 2/27: pt currently limited due to symptoms   ? Time 12   ? Period Weeks   ? Status New   ? Target Date 01/04/22   ?  ? PT LONG TERM GOAL #3  ? Title Pt will improve DGI by at least 3 points in order to demonstrate clinically significant improvement in balance and decreased risk for falls.   ? Baseline 2/27: to be assessed next 1-2 sessions; 3/6: 17/24   ? Time 12   ? Period Weeks   ? Status New   ?  Target Date 01/04/22   ?  ? PT LONG TERM GOAL #4  ? Title Pt will improve ABC by at least 13% in order to demonstrate clinically significant improvement in balance confidence.   ? Baseline 2/27: 41.9%   ? Time 12   ? Period Weeks   ? Status New   ? Target Date 01/04/22   ?  ? PT LONG TERM GOAL #5  ? Title Patient will increase FOTO score to equal to or greater than 57  to demonstrate statistically significant improvement in mobility and quality of life.   ? Baseline 2/27: 52%   ? Time 12   ? Period Weeks   ? Status New   ? Target Date 01/04/22   ? ?  ?  ? ?  ? ? ? ? ? ? ? ? Plan - 12/21/21 1712   ? ? Clinical Impression Statement Pt symptoms more easily triggered today with NMR interventions, although dizziness reached no greater than 2/10 on this date. Pt did present to session feeling she was having her "pre-symptoms." Instructed pt in symptom modulation techniques. Focused mainly on therex to allow for symptom decrease. Per pt report she is progressing with optokinetic stimulation and VOR interventions outside of therapy. The pt will benefit from further skilled PT to improve dizziness symptoms and balance to increase QOL and decrease fall risk.   ? Personal Factors and Comorbidities Age;Time since onset of injury/illness/exacerbation;Sex;Fitness;Comorbidity 2   ? Comorbidities anxiety, Intractable migraine with vestibular symptoms   ? Examination-Activity Limitations Bend;Lift;Stand;Locomotion Level;Stairs;Reach Overhead;Bed Mobility   ? Examination-Participation Restrictions  Volunteer;Community Activity;Shop;Yard Work;Cleaning;Driving;Laundry;Meal Prep   ? Stability/Clinical Decision Making Evolving/Moderate complexity   ? Rehab Potential Good   ? PT Frequency 2x / week   ? PT Duration

## 2021-12-22 ENCOUNTER — Ambulatory Visit: Payer: BC Managed Care – PPO

## 2021-12-28 ENCOUNTER — Ambulatory Visit: Payer: BC Managed Care – PPO

## 2021-12-28 DIAGNOSIS — R42 Dizziness and giddiness: Secondary | ICD-10-CM

## 2021-12-28 DIAGNOSIS — R2681 Unsteadiness on feet: Secondary | ICD-10-CM

## 2021-12-28 DIAGNOSIS — M6281 Muscle weakness (generalized): Secondary | ICD-10-CM

## 2021-12-28 NOTE — Therapy (Signed)
?[facility] ?[place], [place], [address] ?Phone: [phone]   Fax:  [phone] ? ?Physical Therapy Treatment/Physical Therapy Progress Note ? ? ?Dates of reporting period  10/12/2021   to   12/28/2021 ? ? ?Patient Details  ?Name: Helen Trevino ?MRN: [id number] ?Date of Birth: 1960/08/29 ?Referring Provider (PT): Vladimir Crofts, MD ? ? ?Encounter Date: 12/28/2021 ? ? PT End of Session - 12/28/21 1135   ? ? Visit Number 10   ? Number of Visits 25   ? Date for PT Re-Evaluation 01/04/22   ? Authorization Type BCBS COMM Pro- 30VL per calendar year (PT/chiropractic)   ? Authorization Time Period 10/12/21-01/04/22   ? Progress Note Due on Visit 10   ? PT Start Time [phone]   ? PT Stop Time 0930   ? PT Time Calculation (min) 43 min   ? Equipment Utilized During Treatment Gait belt   ? Activity Tolerance Patient tolerated treatment well;No increased pain   ? Behavior During Therapy [facility] for tasks assessed/performed   ? ?  ?  ? ?  ? ? ?Past Medical History:  ?Diagnosis Date  ? Meniere's disease   ? Seasonal allergies   ? ? ?Past Surgical History:  ?Procedure Laterality Date  ? HAND SURGERY  1983  ? [place]  ? ? ?There were no vitals filed for this visit. ? ? Subjective Assessment - 12/28/21 0849   ? ? Subjective Pt states, "I'm better than usual." She reports a little bit of pressure in her ear but that it's not what it used to be, she states, "huge improvement." She reports her dizziness is "much, much better."   ? Pertinent History Pt is a pleasant 62 yo female presenting to outpatient PT with c/o dizziness and imbalance. 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 She reports she has had migraines for many years, but that dizziness and unsteadiness has worsened in the past two months. She reports lingering symptoms between migraine attacks. She reports episodes of vertigo that can last 30 min to hours. Pt has been to an ENT, she reports  they ruled out meniere's disease (this was several years ago). Pt has not had hearing tested recently.  Her symptoms include: tinnitus, ear ache, nausea, metalic taste in her mouth with migraines, headaches, vertigo, fatigue, "wax paper" feeling over eyes, light sensitivity and noise sensitivity. She has difficulty with screens, busy stores. She reports pain and tightness felt in her neck, primarily posteriorly. When pt has a headache she feels it primarily on R side and top center of her head. Pt reports she has been diagnosed with a "histamine issue" and is on a "low histamine diet."  Pt reports during migraine episodes she finds it helps best to be still and quiet. She will use heat and ice over her forehead. Pt reports one recent fall on Valentine's day when going to hug her daughter. She reports she fell into her couch. Pt does report history of vestibular rehab, but that it was years ago. Pt wears glasses. Per chart PMH is significant for Intractable migraine with vestibular symptoms, anxiety.   ? Limitations House hold activities;Standing;Walking;Lifting   ? How long can you sit comfortably? not affected   ? How long can you stand comfortably? at times limited d/t unsteadiness/dizziness   ? How long can you walk comfortably? limited due to unsteadiness, dizziness   ? Diagnostic tests no recent pertinent testing   ?  Patient Stated Goals Pt would like to manage her symptoms better and "ease back into" daily activities   ? Currently in Pain? No/denies   ? ?  ?  ? ?  ? ? ? ? ? [facility] PT Assessment - 12/28/21 0001   ? ?  ? Dynamic Gait Index  ? Level Surface Normal   ? Change in Gait Speed Normal   ? Gait with Horizontal Head Turns Mild Impairment   ? Gait with Vertical Head Turns Mild Impairment   ? Gait and Pivot Turn Normal   ? Step Over Obstacle Normal   ? Step Around Obstacles Normal   ? Steps Mild Impairment   ? Total Score 21   ? ?  ?  ? ?  ?INTERVENTIONS - goals reassessed for progress note. See goal section  for details ? ?NMR: ? ?Seated VORx2 vertical and horizontal head turns 30 sec of each .  ?Dizziness reaches 2/10 for each. Recovery interval taken. ? ?DHI: 44 ? ?ABC scale: 64.38% goal met ? ?FOTO: 56% partially met ? ?DGI: 21/24 ? ?Instructed pt in advanced HEP, perform within symptom tolerance (no greater than 2/10 dizziness) ?Access Code: [email or web address] ?URL: [email or web address] ?Date: 12/28/2021 ?Prepared by: Ricard Dillon ? ?Exercises ?- Seated Gaze Stabilization with Head Rotation and Horizontal Arm Movement  - 2 x daily - 7 x weekly - 3 sets - 1 reps - 60 hold ?- Seated Gaze Stabilization with Head Nod and Vertical Arm Movement  - 2 x daily - 7 x weekly - 3 sets - 1 reps - 60 hold ? ?Ascending/descending 1/2 stairs steps without UE support x multiple reps, improved with practice.  Rates easy-medium. Reports 1/10 dizziness.  ? ?ThereEx ?Leg press: ?25# 10x ?40# 10x ?55# 10x ? ?  ? ? PT Short Term Goals - 12/28/21 0851   ? ?  ? PT SHORT TERM GOAL #1  ? Title Pt will be independent with HEP in order to improve strength and balance in order to decrease fall risk and improve function at home and work.   ? Baseline 2/27: issued on this date; 5/15: pt reports indep with interventions   ? Time 6   ? Period Weeks   ? Status Achieved   ? Target Date 11/23/21   ? ?  ?  ? ?  ? ? ? ? PT Long Term Goals - 12/28/21 0852   ? ?  ? PT LONG TERM GOAL #1  ? Title Pt will decrease DHI score by at least 18 points in order to demonstrate clinically significant reduction in disability   ? Baseline 2/27: 48; 5/15: 44   ? Time 12   ? Period Weeks   ? Status On-going   ? Target Date 01/04/22   ?  ? PT LONG TERM GOAL #2  ? Title Pt will report at least 50% reduction in symptoms in order to improve function at home and ability to participate in community activities.   ? Baseline 2/27: pt currently limited due to symptoms; 5/15: 40% reduction   ? Time 12   ? Period Weeks   ? Status Partially Met   ? Target Date 01/04/22   ?  ? PT  LONG TERM GOAL #3  ? Title Pt will improve DGI by at least 3 points in order to demonstrate clinically significant improvement in balance and decreased risk for falls.   ? Baseline 2/27: to be assessed next 1-2 sessions; 3/6: 17/24; 5/15: 21/24   ?  Time 12   ? Period Weeks   ? Status Achieved   ? Target Date 01/04/22   ?  ? PT LONG TERM GOAL #4  ? Title Pt will improve ABC by at least 13% in order to demonstrate clinically significant improvement in balance confidence.   ? Baseline 2/27: 41.9% 5/15L 64.38%   ? Time 12   ? Period Weeks   ? Status Achieved   ? Target Date 01/04/22   ?  ? PT LONG TERM GOAL #5  ? Title Patient will increase FOTO score to equal to or greater than 57  to demonstrate statistically significant improvement in mobility and quality of life.   ? Baseline 2/27: 52%; 5/15: 56%   ? Time 12   ? Period Weeks   ? Status Partially Met   ? Target Date 01/04/22   ? ?  ?  ? ?  ? ? ? ? ? ? ? ? Plan - 12/28/21 1131   ? ? Clinical Impression Statement Goals reassessed for progress note. Pt making gains on all goals AEB achieving 2 therapy goals and partially meeting two other goals. This indicates improvements in balance, balance confidence, decreased symptoms related to dizziness and decreased fall risk. Pt improvement on FOTO also indicates increased perceived functional mobility and QOL. PT advanced pt's HEP and instructed pt in progressing HEP independently. Pt to come back in another month for a follow-up to determine progress as she feels current symptoms have improved at least 40%. Pt is agreeable to plan. The pt will benefit from further skilled PT to improve balance, strength and dizziness to further increase QOL and decrease fall risk.   ? Personal Factors and Comorbidities Age;Time since onset of injury/illness/exacerbation;Sex;Fitness;Comorbidity 2   ? Comorbidities anxiety, Intractable migraine with vestibular symptoms   ? Examination-Activity Limitations Bend;Lift;Stand;Locomotion  Level;Stairs;Reach Overhead;Bed Mobility   ? Examination-Participation Restrictions Volunteer;Community Activity;Shop;Yard Work;Cleaning;Driving;Laundry;Meal Prep   ? Stability/Clinical Decision Making Evolving/Mo

## 2022-01-08 ENCOUNTER — Ambulatory Visit: Payer: BC Managed Care – PPO

## 2022-01-22 ENCOUNTER — Ambulatory Visit: Payer: BC Managed Care – PPO | Attending: Neurology

## 2022-01-22 DIAGNOSIS — R42 Dizziness and giddiness: Secondary | ICD-10-CM | POA: Diagnosis present

## 2022-01-22 DIAGNOSIS — R2681 Unsteadiness on feet: Secondary | ICD-10-CM | POA: Insufficient documentation

## 2022-01-22 NOTE — Therapy (Signed)
[place] MAIN [facility] SERVICES [address] [place], [place], [address] Phone: [phone]   Fax:  [phone]  Physical Therapy Treatment/RECERT  Patient Details  Name: Helen Trevino MRN: [id number] Date of Birth: Jan 11, 1961 Referring Provider (PT): Vladimir Crofts, MD   Encounter Date: 01/22/2022   PT End of Session - 01/22/22 1123     Visit Number 11    Number of Visits 25    Date for PT Re-Evaluation 03/19/22    Authorization Type [employer] COMM Pro- 30VL per calendar year (PT/chiropractic)    Authorization Time Period 10/12/21-01/04/22    Progress Note Due on Visit 10    PT Start Time 0930    PT Stop Time 1014    PT Time Calculation (min) 44 min    Equipment Utilized During Treatment Gait belt    Activity Tolerance Patient tolerated treatment well;No increased pain    Behavior During Therapy WFL for tasks assessed/performed             Past Medical History:  Diagnosis Date   Meniere's disease    Seasonal allergies     Past Surgical History:  Procedure Laterality Date   HAND SURGERY  1983   WISDOM TOOTH EXTRACTION  1979    There were no vitals filed for this visit.   Subjective Assessment - 01/22/22 0930     Subjective Pt reports average dizziness since she's last been here is a 2/10. Pt reports she is consistent with strengthening, but is not too consistent with her VOR exercises.Pt states, "I've picked up reading again." Pt still has some difficulty with viewing shadows, looking at the ceiling. Pt feels comfortable driving now. Reports general improvement.    Pertinent History Pt is a pleasant 61 yo female presenting to outpatient PT with c/o dizziness and imbalance. 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 She reports she has had migraines for many years, but that dizziness and unsteadiness has worsened in the past two months. She reports lingering symptoms between migraine attacks. She reports episodes of vertigo that can  last 30 min to hours. Pt has been to an ENT, she reports they ruled out meniere's disease (this was several years ago). Pt has not had hearing tested recently.  Her symptoms include: tinnitus, ear ache, nausea, metalic taste in her mouth with migraines, headaches, vertigo, fatigue, "wax paper" feeling over eyes, light sensitivity and noise sensitivity. She has difficulty with screens, busy stores. She reports pain and tightness felt in her neck, primarily posteriorly. When pt has a headache she feels it primarily on R side and top center of her head. Pt reports she has been diagnosed with a "histamine issue" and is on a "low histamine diet."  Pt reports during migraine episodes she finds it helps best to be still and quiet. She will use heat and ice over her forehead. Pt reports one recent fall on Valentine's day when going to hug her daughter. She reports she fell into her couch. Pt does report history of vestibular rehab, but that it was years ago. Pt wears glasses. Per chart PMH is significant for Intractable migraine with vestibular symptoms, anxiety.    Limitations House hold activities;Standing;Walking;Lifting    How long can you sit comfortably? not affected    How long can you stand comfortably? at times limited d/t unsteadiness/dizziness    How long can you walk comfortably? limited due to unsteadiness, dizziness    Diagnostic tests no recent pertinent testing  Patient Stated Goals Pt would like to manage her symptoms better and "ease back into" daily activities    Currently in Pain? No/denies            Interventions - goals reassessed, see goal section for complete details  NMR: Gait belt donned and CGA provided unless otherwise specified  FOTO: 56  Standing VORx2 with horizontal head turns, busy background discontinued after 20 sec due to significant increase in dizziness  SLB 2x30 sec each LE   Standing VORx2 with vertical and horizontal head turns, plain background 2x30 sec for  each. Minimal increase in symptoms.   Airex EO WBOS 2x30 sec   Airex EC WBOS 45 sec; 1-2 instances of finger touch support on bar  Firm surface, WBOS, VORx1, horizontal head turns 2x30 sec  Firm surface, WBOS, VORx1, vertical  head turns 2x30 sec; mild unsteadiness Dizziness reaches almost "2/10"  Tandem walking over 10 meters x 2 sets   Plank pose to prone press up in increased lumbar thoracic extension 3x. Cuing for forward gaze. Performed as habituation intervention.   Downward dog to prone press up 2x. Performed as habituation intervention.  Pt with 2/10 dizziness upon sitting back up. Pt takes recovery interval    TherEx- Matrix Cable Machine Rows- 12.5# 1x10 Rated easy 17.5# 1x10 rates medium 22.5# 1x5   Pt reports no dizziness at end of session   Pt educated throughout session about proper posture and technique with exercises. Improved exercise technique, movement at target joints, use of target muscles after min to mod verbal, visual, tactile cues.  Rationale for Evaluation and Treatment Rehabilitation    PT Education - 01/22/22 1123     Education Details exercise technique, education in importance of VOR HEP    Person(s) Educated Patient    Methods Explanation;Demonstration;Verbal cues    Comprehension Verbalized understanding;Returned demonstration              PT Short Term Goals - 01/22/22 1124       PT SHORT TERM GOAL #1   Title Pt will be independent with HEP in order to improve strength and balance in order to decrease fall risk and improve function at home and work.    Baseline 2/27: issued on this date; 5/15: pt reports indep with interventions    Time 6    Period Weeks    Status Achieved    Target Date 11/23/21               PT Long Term Goals - 01/22/22 1125       PT LONG TERM GOAL #1   Title Pt will decrease DHI score by at least 18 points in order to demonstrate clinically significant reduction in disability    Baseline 2/27:  48; 5/15: 44; 6/9 deferred to next visit    Time 8    Period Weeks    Status On-going    Target Date 03/19/22      PT LONG TERM GOAL #2   Title Pt will report at least 50% reduction in symptoms in order to improve function at home and ability to participate in community activities.    Baseline 2/27: pt currently limited due to symptoms; 5/15: 40% reduction; 6/9: 40% reduction    Time 8    Period Weeks    Status Partially Met    Target Date 03/19/22      PT LONG TERM GOAL #3   Title Pt will improve DGI by at least 3   points in order to demonstrate clinically significant improvement in balance and decreased risk for falls.    Baseline 2/27: to be assessed next 1-2 sessions; 3/6: 17/24; 5/15: 21/24    Time 12    Period Weeks    Status Achieved    Target Date 01/04/22      PT LONG TERM GOAL #4   Title Pt will improve ABC by at least 13% in order to demonstrate clinically significant improvement in balance confidence.    Baseline 2/27: 41.9% 5/15L 64.38%    Time 12    Period Weeks    Status Achieved    Target Date 01/04/22      PT LONG TERM GOAL #5   Title Patient will increase FOTO score to equal to or greater than 57  to demonstrate statistically significant improvement in mobility and quality of life.    Baseline 2/27: 52%; 5/15: 56%; 6/9: 56%    Time 12    Period Weeks    Status Partially Met    Target Date 03/19/22                   Plan - 01/22/22 1126     Clinical Impression Statement Pt returns for a follow-up visit. Pt FOTO and reduction in symptoms remain 56% and 40% (unchanged since prior assessment). Pt does report she has not been consistent with VOR HEP, possibly impacting results of testing. PT reinstructed in importance of HEP. Pt verbalized understanding. While scores have not changed, pt does report resuming multiple activities outside of PT and feeling general improvement. PT and pt discuss plan for pt to follow up 1-2 more times in another month to  confirm progress and understanding of advancing HEP. The pt is agreeable to plan and verbalized understanding. Patient's condition has the potential to improve in response to therapy. Maximum improvement is yet to be obtained. The anticipated improvement is attainable and reasonable in a generally predictable time. The pt will benefit from further skilled PT to improve balance and decrease motion/positionally provoked dizziness.    Personal Factors and Comorbidities Age;Time since onset of injury/illness/exacerbation;Sex;Fitness;Comorbidity 2    Comorbidities anxiety, Intractable migraine with vestibular symptoms    Examination-Activity Limitations Bend;Lift;Stand;Locomotion Level;Stairs;Reach Overhead;Bed Mobility    Examination-Participation Restrictions Volunteer;Community Activity;Shop;Yard Work;Cleaning;Driving;Laundry;Meal Prep    Stability/Clinical Decision Making Evolving/Moderate complexity    Rehab Potential Good    PT Frequency 2x / week    PT Duration 8 weeks    PT Treatment/Interventions ADLs/Self Care Home Management;Biofeedback;Canalith Repostioning;Cryotherapy;Electrical Stimulation;Moist Heat;Traction;Ultrasound;DME Instruction;Gait Scientist, forensic;Therapeutic exercise;Therapeutic activities;Functional mobility training;Balance training;Neuromuscular re-education;Patient/family education;Orthotic Fit/Training;Wheelchair mobility training;Manual techniques;Passive range of motion;Dry needling;Energy conservation;Taping;Vestibular;Visual/perceptual remediation/compensation;Joint Manipulations;Spinal Manipulations;Parrafin    PT Next Visit Plan optokinetic stimulation, VOR, balance, strengthening and mobility of cervical spine    PT Home Exercise Plan Access Code: [email or web address]; Access Code: [email or web address]; 3/21: Access Code: [email or web address]; 4/11: Access Code: [email or web address]; 4/25: optokinetic stimulation video vertical bars moving to R (3 min, performed in second bouts to tolerance); 5/15: Access Code:  [email or web address]    Consulted and Agree with Plan of Care Patient             Patient will benefit from skilled therapeutic intervention in order to improve the following deficits and impairments:  Abnormal gait, Decreased balance, Decreased mobility, Dizziness, Decreased strength, Decreased activity tolerance, Decreased range of motion, Hypomobility, Impaired sensation, Improper body mechanics, Pain, Difficulty walking, Impaired flexibility, Increased muscle spasms, Impaired vision/preception  Visit Diagnosis: Dizziness and giddiness  Unsteadiness on feet  Problem List There are no problems to display for this patient.   Haley R Barak, PT 01/22/2022, 11:32 AM  [place] [facility] SERVICES [address] [place], [place], [address] Phone: [phone]   Fax:  [phone]  Name: Helen Trevino MRN: [id number] Date of Birth: 12/22/1960    

## 2022-02-22 ENCOUNTER — Ambulatory Visit: Payer: BC Managed Care – PPO | Attending: Neurology

## 2022-02-22 DIAGNOSIS — R2681 Unsteadiness on feet: Secondary | ICD-10-CM | POA: Diagnosis present

## 2022-02-22 DIAGNOSIS — R42 Dizziness and giddiness: Secondary | ICD-10-CM | POA: Diagnosis not present

## 2022-02-22 NOTE — Therapy (Signed)
OUTPATIENT PHYSICAL THERAPY TREATMENT NOTE/DISCHARGE SUMMARY   Patient Name: Helen Trevino MRN: [id number] DOB:September 08, 1960, 61 y.o., female Today's Date: 02/22/2022  PCP: Marinda Elk, MD REFERRING PROVIDER: Vladimir Crofts, MD   PT End of Session - 02/22/22 1004     Visit Number 12    Number of Visits 25    Date for PT Re-Evaluation 03/19/22    Authorization Type [employer] COMM Pro- 30VL per calendar year (PT/chiropractic)    Authorization Time Period 10/12/21-01/04/22    Progress Note Due on Visit 10    PT Start Time 1016    PT Stop Time 1048    PT Time Calculation (min) 32 min    Equipment Utilized During Treatment Gait belt    Activity Tolerance Patient tolerated treatment well    Behavior During Therapy WFL for tasks assessed/performed             Past Medical History:  Diagnosis Date   Meniere's disease    Seasonal allergies    Past Surgical History:  Procedure Laterality Date   HAND [place]   There are no problems to display for this patient.   REFERRING DIAG: Intractable migraine  THERAPY DIAG:  Dizziness and giddiness  Unsteadiness on feet  Rationale for Evaluation and Treatment Rehabilitation  PERTINENT HISTORY: Pt is a pleasant 61 yo female presenting to outpatient PT with c/o dizziness and imbalance. 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 She reports she has had migraines for many years, but that dizziness and unsteadiness has worsened in the past two months. She reports lingering symptoms between migraine attacks. She reports episodes of vertigo that can last 30 min to hours. Pt has been to an ENT, she reports they ruled out meniere's disease (this was several years ago). Pt has not had hearing tested recently.  Her symptoms include: tinnitus, ear ache, nausea, metalic taste in her mouth with migraines, headaches, vertigo, fatigue, "wax paper" feeling over eyes, light sensitivity and noise sensitivity. She has  difficulty with screens, busy stores. She reports pain and tightness felt in her neck, primarily posteriorly. When pt has a headache she feels it primarily on R side and top center of her head. Pt reports she has been diagnosed with a "histamine issue" and is on a "low histamine diet."  Pt reports during migraine episodes she finds it helps best to be still and quiet. She will use heat and ice over her forehead. Pt reports one recent fall on Valentine's day when going to hug her daughter. She reports she fell into her couch. Pt does report history of vestibular rehab, but that it was years ago. Pt wears glasses. Per chart PMH is significant for Intractable migraine with vestibular symptoms, anxiety.  PRECAUTIONS: fall  SUBJECTIVE: Pt states, "I have my balance." She has started doing some yoga. She reports reduction in staggering and stumbling. Pt reports she has been OK even with consistently bad weather recently. She was still able to do yoga today. Pt rates dizziness currently as 0-1/10.  Pt feels indep with HEP and ready to discharge from PT. Pt just got over some back issues, she had a tight back.   PAIN:  Are you having pain? No   TODAY'S TREATMENT:   02/22/2022: NMR: Gait belt donned and CGA provided unless otherwise specified   Reassessed goals to determine status prior to d/c -- Pt has met all goals. See goal section below for complete  details.  Instructed pt through VOR progressions: -from VORx1 to VORx2 and from seated>standing>ambulating with plain or busy background and using horizontal, vertical or diagonal head turns.   Additionally, instructed pt in how to advance LE positioning with static VOR interventions:  WBOS>NBOS>semi-tandem>tandem>SLB.   Discussed performing VOR HEP within symptom tolerance and near support surface as needed. Pt performed 1-2 reps of these progressions for 30 sec intervals. PT provided HEP handout of these exercises (see below).   At support  surface: SLB 30 sec each LE Tandem 30 sec each LE   PATIENT EDUCATION: Education details: d/c recommendations, updated HEP, maintenance program, activity recommendations following d/c Person educated: Patient Education method: Explanation, Demonstration, Verbal cues, and Handouts Education comprehension: verbalized understanding and returned demonstration   HOME EXERCISE PROGRAM:   02/22/22: Access Code: [email or web address] URL: [email or web address] Date: 02/22/2022 Prepared by: Ricard Dillon  Exercises - Seated Gaze Stabilization with Head Rotation  - 1 x daily - 7 x weekly - 2 sets - 1 reps - 60 hold - Seated Gaze Stabilization with Head Nod  - 1 x daily - 7 x weekly - 2 sets - 1 reps - 60 hold - Walking Gaze Stabilization Head Nod  - 1 x daily - 7 x weekly - 2 sets - 1 reps - 60 hold - Seated Gaze Stabilization with Head Rotation and Horizontal Arm Movement  - 1 x daily - 7 x weekly - 2 sets - 1 reps - 60 hold - Seated Gaze Stabilization with Head Nod and Vertical Arm Movement  - 1 x daily - 7 x weekly - 2 sets - 1 reps - 60 hold - Tandem Stance  - 1 x daily - 7 x weekly - 2 sets - 2 reps - 30 hold - Single Leg Balance with Knee Flexion  - 1 x daily - 7 x weekly - 2 sets - 2 reps - 30 hold PT additionally provided notes on how to advance the above interventions (See note for details)  Previous: Access Code: [email or web address]; Access Code: [email or web address]; 3/21: Access Code: [email or web address]; 4/11: Access Code: [email or web address]; 4/25: optokinetic stimulation video vertical bars moving to R (3 min, performed in second bouts to tolerance); 5/15: Access Code: [email or web address]    PT Short Term Goals - 01/22/22 1124       PT SHORT TERM GOAL #1   Title Pt will be independent with HEP in order to improve strength and balance in order to decrease fall risk and improve function at home and work.    Baseline 2/27: issued on this date; 5/15: pt reports indep with interventions    Time 6    Period Weeks    Status Achieved     Target Date 11/23/21              PT Long Term Goals - 01/22/22 1125       PT LONG TERM GOAL #1   Title Pt will decrease DHI score by at least 18 points in order to demonstrate clinically significant reduction in disability    Baseline 2/27: 48; 5/15: 44; 6/9 deferred to next visit; 7/10: 18   Time 8    Period Weeks    Status Achieved   Target Date 03/19/22      PT LONG TERM GOAL #2   Title Pt will report at least 50% reduction in symptoms in order to improve function at home and ability to participate in community activities.    Baseline 2/27: pt currently limited due to  symptoms; 5/15: 40% reduction; 6/9: 40% reduction; 7/10: pt reports 65% reduction in symptoms.   Time 8    Period Weeks    Status Achieved    Target Date 03/19/22      PT LONG TERM GOAL #3   Title Pt will improve DGI by at least 3 points in order to demonstrate clinically significant improvement in balance and decreased risk for falls.    Baseline 2/27: to be assessed next 1-2 sessions; 3/6: 17/24; 5/15: 21/24    Time 12    Period Weeks    Status Achieved    Target Date 01/04/22      PT LONG TERM GOAL #4   Title Pt will improve ABC by at least 13% in order to demonstrate clinically significant improvement in balance confidence.    Baseline 2/27: 41.9% 5/15L 64.38%    Time 12    Period Weeks    Status Achieved    Target Date 01/04/22      PT LONG TERM GOAL #5   Title Patient will increase FOTO score to equal to or greater than 57  to demonstrate statistically significant improvement in mobility and quality of life.    Baseline 2/27: 52%; 5/15: 56%; 6/9: 56% ; 7/10: 60%   Time 12    Period Weeks    Status Achieved   Target Date 03/19/22              Plan -     Clinical Impression Statement Goals reassessed and advanced HEP provided for last PT session in order to continue and maintain gains made in therapy. Pt has now met all therapy goals and reports return to activities previously stopped  due to dizziness. She reports overall improvement in her balance and a 65% reduction in her symptoms. Pt understands advanced HEP and discharge recommendations. Pt is agreeable to discharge on this date. The pt does not require further skilled PT at this time.    Personal Factors and Comorbidities Age;Time since onset of injury/illness/exacerbation;Sex;Fitness;Comorbidity 2    Comorbidities anxiety, Intractable migraine with vestibular symptoms    Examination-Activity Limitations Bend;Lift;Stand;Locomotion Level;Stairs;Reach Overhead;Bed Mobility    Examination-Participation Restrictions Volunteer;Community Activity;Shop;Yard Work;Cleaning;Driving;Laundry;Meal Prep    Stability/Clinical Decision Making Evolving/Moderate complexity    Rehab Potential Good    PT Frequency 2x / week    PT Duration 8 weeks    PT Treatment/Interventions ADLs/Self Care Home Management;Biofeedback;Canalith Repostioning;Cryotherapy;Electrical Stimulation;Moist Heat;Traction;Ultrasound;DME Instruction;Gait Scientist, forensic;Therapeutic exercise;Therapeutic activities;Functional mobility training;Balance training;Neuromuscular re-education;Patient/family education;Orthotic Fit/Training;Wheelchair mobility training;Manual techniques;Passive range of motion;Dry needling;Energy conservation;Taping;Vestibular;Visual/perceptual remediation/compensation;Joint Manipulations;Spinal Manipulations;Parrafin    PT Next Visit Plan optokinetic stimulation, VOR, balance, strengthening and mobility of cervical spine    PT Home Exercise Plan Access Code: [email or web address]; Access Code: [email or web address]; 3/21: Access Code: [email or web address]; 4/11: Access Code: [email or web address]; 4/25: optokinetic stimulation video vertical bars moving to R (3 min, performed in second bouts to tolerance); 5/15: Access Code: [email or web address]    Consulted and Agree with Plan of Care Patient               Zollie Pee, PT 02/22/2022, 11:31 AM

## 2022-07-26 ENCOUNTER — Ambulatory Visit (INDEPENDENT_AMBULATORY_CARE_PROVIDER_SITE_OTHER): Payer: BC Managed Care – PPO | Admitting: Dermatology

## 2022-07-26 DIAGNOSIS — L578 Other skin changes due to chronic exposure to nonionizing radiation: Secondary | ICD-10-CM | POA: Diagnosis not present

## 2022-07-26 DIAGNOSIS — L814 Other melanin hyperpigmentation: Secondary | ICD-10-CM

## 2022-07-26 DIAGNOSIS — Z808 Family history of malignant neoplasm of other organs or systems: Secondary | ICD-10-CM

## 2022-07-26 DIAGNOSIS — Z1283 Encounter for screening for malignant neoplasm of skin: Secondary | ICD-10-CM

## 2022-07-26 DIAGNOSIS — D229 Melanocytic nevi, unspecified: Secondary | ICD-10-CM

## 2022-07-26 DIAGNOSIS — L820 Inflamed seborrheic keratosis: Secondary | ICD-10-CM | POA: Diagnosis not present

## 2022-07-26 DIAGNOSIS — I8393 Asymptomatic varicose veins of bilateral lower extremities: Secondary | ICD-10-CM

## 2022-07-26 DIAGNOSIS — L821 Other seborrheic keratosis: Secondary | ICD-10-CM

## 2022-07-26 NOTE — Patient Instructions (Signed)
Due to recent changes in healthcare laws, you may see results of your pathology and/or laboratory studies on MyChart before the doctors have had a chance to review them. We understand that in some cases there may be results that are confusing or concerning to you. Please understand that not all results are received at the same time and often the doctors may need to interpret multiple results in order to provide you with the best plan of care or course of treatment. Therefore, we ask that you please give us 2 business days to thoroughly review all your results before contacting the office for clarification. Should we see a critical lab result, you will be contacted sooner.   If You Need Anything After Your Visit  If you have any questions or concerns for your doctor, please call our main line at [phone] and press option 4 to reach your doctor's medical assistant. If no one answers, please leave a voicemail as directed and we will return your call as soon as possible. Messages left after 4 pm will be answered the following business day.   You may also send us a message via MyChart. We typically respond to MyChart messages within 1-2 business days.  For prescription refills, please ask your pharmacy to contact our office. Our fax number is [phone].  If you have an urgent issue when the clinic is closed that cannot wait until the next business day, you can page your doctor at the number below.    Please note that while we do our best to be available for urgent issues outside of office hours, we are not available 24/7.   If you have an urgent issue and are unable to reach us, you may choose to seek medical care at your doctor's office, retail clinic, urgent care center, or emergency room.  If you have a medical emergency, please immediately call 911 or go to the emergency department.  Pager Numbers  - Dr. Kowalski: [phone]  - Dr. Moye: [phone]  - Dr. Stewart:  [phone]  In the event of inclement weather, please call our main line at [phone] for an update on the status of any delays or closures.  Dermatology Medication Tips: Please keep the boxes that topical medications come in in order to help keep track of the instructions about where and how to use these. Pharmacies typically print the medication instructions only on the boxes and not directly on the medication tubes.   If your medication is too expensive, please contact our office at [phone] option 4 or send us a message through MyChart.   We are unable to tell what your co-pay for medications will be in advance as this is different depending on your insurance coverage. However, we may be able to find a substitute medication at lower cost or fill out paperwork to get insurance to cover a needed medication.   If a prior authorization is required to get your medication covered by your insurance company, please allow us 1-2 business days to complete this process.  Drug prices often vary depending on where the prescription is filled and some pharmacies may offer cheaper prices.  The website www.goodrx.com contains coupons for medications through different pharmacies. The prices here do not account for what the cost may be with help from insurance (it may be cheaper with your insurance), but the website can give you the price if you did not use any insurance.  - You can print the associated coupon and take it with   your prescription to the pharmacy.  - You may also stop by our office during regular business hours and pick up a GoodRx coupon card.  - If you need your prescription sent electronically to a different pharmacy, notify our office through [facility] MyChart or by phone at [phone] option 4.     Si Usted Necesita Algo Despus de Su Visita  Tambin puede enviarnos un mensaje a travs de MyChart. Por lo general respondemos a los mensajes de MyChart en el transcurso de 1 a 2  das hbiles.  Para renovar recetas, por favor pida a su farmacia que se ponga en contacto con nuestra oficina. Nuestro nmero de fax es el [phone].  Si tiene un asunto urgente cuando la clnica est cerrada y que no puede esperar hasta el siguiente da hbil, puede llamar/localizar a su doctor(a) al nmero que aparece a continuacin.   Por favor, tenga en cuenta que aunque hacemos todo lo posible para estar disponibles para asuntos urgentes fuera del horario de oficina, no estamos disponibles las 24 horas del da, los 7 das de la semana.   Si tiene un problema urgente y no puede comunicarse con nosotros, puede optar por buscar atencin mdica  en el consultorio de su doctor(a), en una clnica privada, en un centro de atencin urgente o en una sala de emergencias.  Si tiene una emergencia mdica, por favor llame inmediatamente al 911 o vaya a la sala de emergencias.  Nmeros de bper  - Dr. Kowalski: [phone]  - Dra. Moye: [phone]  - Dra. Stewart: [phone]  En caso de inclemencias del tiempo, por favor llame a nuestra lnea principal al [phone] para una actualizacin sobre el estado de cualquier retraso o cierre.  Consejos para la medicacin en dermatologa: Por favor, guarde las cajas en las que vienen los medicamentos de uso tpico para ayudarle a seguir las instrucciones sobre dnde y cmo usarlos. Las farmacias generalmente imprimen las instrucciones del medicamento slo en las cajas y no directamente en los tubos del medicamento.   Si su medicamento es muy caro, por favor, pngase en contacto con nuestra oficina llamando al [phone] y presione la opcin 4 o envenos un mensaje a travs de MyChart.   No podemos decirle cul ser su copago por los medicamentos por adelantado ya que esto es diferente dependiendo de la cobertura de su seguro. Sin embargo, es posible que podamos encontrar un medicamento sustituto a menor costo o llenar un formulario para que el  seguro cubra el medicamento que se considera necesario.   Si se requiere una autorizacin previa para que su compaa de seguros cubra su medicamento, por favor permtanos de 1 a 2 das hbiles para completar este proceso.  Los precios de los medicamentos varan con frecuencia dependiendo del lugar de dnde se surte la receta y alguna farmacias pueden ofrecer precios ms baratos.  El sitio web www.goodrx.com tiene cupones para medicamentos de diferentes farmacias. Los precios aqu no tienen en cuenta lo que podra costar con la ayuda del seguro (puede ser ms barato con su seguro), pero el sitio web puede darle el precio si no utiliz ningn seguro.  - Puede imprimir el cupn correspondiente y llevarlo con su receta a la farmacia.  - Tambin puede pasar por nuestra oficina durante el horario de atencin regular y recoger una tarjeta de cupones de GoodRx.  - Si necesita que su receta se enve electrnicamente a una farmacia diferente, informe a nuestra oficina a travs de MyChart de [place]   o por telfono llamando al [phone] y presione la opcin 4.  

## 2022-07-26 NOTE — Progress Notes (Signed)
Follow-Up Visit   Subjective  Helen Trevino is a 61 y.o. female who presents for the following: Annual Exam. Family hx of Melanoma.  The patient presents for Total-Body Skin Exam (TBSE) for skin cancer screening and mole check.  The patient has spots, moles and lesions to be evaluated, some may be new or changing and the patient has concerns that these could be cancer.   The following portions of the chart were reviewed this encounter and updated as appropriate:   Tobacco  Allergies  Meds  Problems  Med Hx  Surg Hx  Fam Hx     Review of Systems:  No other skin or systemic complaints except as noted in HPI or Assessment and Plan.  Objective  Well appearing patient in no apparent distress; mood and affect are within normal limits.  A full examination was performed including scalp, head, eyes, ears, nose, lips, neck, chest, axillae, abdomen, back, buttocks, bilateral upper extremities, bilateral lower extremities, hands, feet, fingers, toes, fingernails, and toenails. All findings within normal limits unless otherwise noted below.  right wrist x 1, chest x 1  (2) (2) Stuck-on, waxy, tan-brown papule -- Discussed benign etiology and prognosis.    Assessment & Plan  Family history of melanoma unknown Family history of Melanoma   Inflamed seborrheic keratosis (2) right wrist x 1, chest x 1  (2) Symptomatic, irritating, patient would like treated.  Destruction of lesion - right wrist x 1, chest x 1  (2) Complexity: simple   Destruction method: cryotherapy   Informed consent: discussed and consent obtained   Timeout:  patient name, date of birth, surgical site, and procedure verified Lesion destroyed using liquid nitrogen: Yes   Region frozen until ice ball extended beyond lesion: Yes   Outcome: patient tolerated procedure well with no complications   Post-procedure details: wound care instructions given    Lentigines - Scattered tan macules - Due to sun exposure -  Benign-appearing, observe - Recommend daily broad spectrum sunscreen SPF 30+ to sun-exposed areas, reapply every 2 hours as needed. - Call for any changes  Seborrheic Keratoses - Stuck-on, waxy, tan-brown papules and/or plaques  - Benign-appearing - Discussed benign etiology and prognosis. - Observe - Call for any changes  Melanocytic Nevi - Tan-brown and/or pink-flesh-colored symmetric macules and papules - Benign appearing on exam today - Observation - Call clinic for new or changing moles - Recommend daily use of broad spectrum spf 30+ sunscreen to sun-exposed areas.   Hemangiomas - Red papules - Discussed benign nature - Observe - Call for any changes  Actinic Damage - Chronic condition, secondary to cumulative UV/sun exposure - diffuse scaly erythematous macules with underlying dyspigmentation - Recommend daily broad spectrum sunscreen SPF 30+ to sun-exposed areas, reapply every 2 hours as needed.  - Staying in the shade or wearing long sleeves, sun glasses (UVA+UVB protection) and wide brim hats (4-inch brim around the entire circumference of the hat) are also recommended for sun protection.  - Call for new or changing lesions.  Varicose Veins/Spider Veins - Dilated blue, purple or red veins at the lower extremities - Reassured - Smaller vessels can be treated by sclerotherapy (a procedure to inject a medicine into the veins to make them disappear) if desired, but the treatment is not covered by insurance. Larger vessels may be covered if symptomatic and we would refer to vascular surgeon if treatment desired.  Cosmetic procedure $350 out of pocket per session  Skin cancer screening performed today.  Return in about 1 year (around 07/27/2023) for TBSE, family history of Melanoma .  IMarye Round, CMA, am acting as scribe for Sarina Ser, MD .  Documentation: I have reviewed the above documentation for accuracy and completeness, and I agree with the  above.  Sarina Ser, MD

## 2022-08-03 ENCOUNTER — Encounter: Payer: Self-pay | Admitting: Dermatology

## 2022-08-19 ENCOUNTER — Other Ambulatory Visit: Payer: Self-pay | Admitting: Physician Assistant

## 2022-08-19 DIAGNOSIS — Z1231 Encounter for screening mammogram for malignant neoplasm of breast: Secondary | ICD-10-CM

## 2022-09-24 ENCOUNTER — Ambulatory Visit
Admission: RE | Admit: 2022-09-24 | Discharge: 2022-09-24 | Disposition: A | Discharge status: Home / Self Care | Payer: BC Managed Care – PPO | Source: Ambulatory Visit | Attending: Physician Assistant | Admitting: Physician Assistant

## 2022-09-24 DIAGNOSIS — Z1231 Encounter for screening mammogram for malignant neoplasm of breast: Secondary | ICD-10-CM | POA: Diagnosis not present

## 2023-07-28 ENCOUNTER — Ambulatory Visit: Payer: BC Managed Care – PPO | Admitting: Dermatology

## 2023-09-26 ENCOUNTER — Other Ambulatory Visit: Payer: Self-pay | Admitting: Physician Assistant

## 2023-09-26 DIAGNOSIS — Z1231 Encounter for screening mammogram for malignant neoplasm of breast: Secondary | ICD-10-CM

## 2023-09-28 ENCOUNTER — Ambulatory Visit
Admission: RE | Admit: 2023-09-28 | Discharge: 2023-09-28 | Disposition: A | Discharge status: Home / Self Care | Payer: BC Managed Care – PPO | Source: Ambulatory Visit | Attending: Physician Assistant | Admitting: Physician Assistant

## 2023-09-28 DIAGNOSIS — Z1231 Encounter for screening mammogram for malignant neoplasm of breast: Secondary | ICD-10-CM | POA: Insufficient documentation

## 2023-10-12 IMAGING — MG MM DIGITAL SCREENING BILAT W/ TOMO AND CAD
8 series · 8 of 24 positions shown · non-contrast
Comparison: Previous exam(s).

CLINICAL DATA: Screening.

EXAM:
DIGITAL SCREENING BILATERAL MAMMOGRAM WITH TOMOSYNTHESIS AND CAD
TECHNIQUE: Bilateral screening digital craniocaudal and mediolateral oblique
mammograms were obtained. Bilateral screening digital breast
tomosynthesis was performed. The images were evaluated with
computer-aided detection.

[R CC synth-2D]
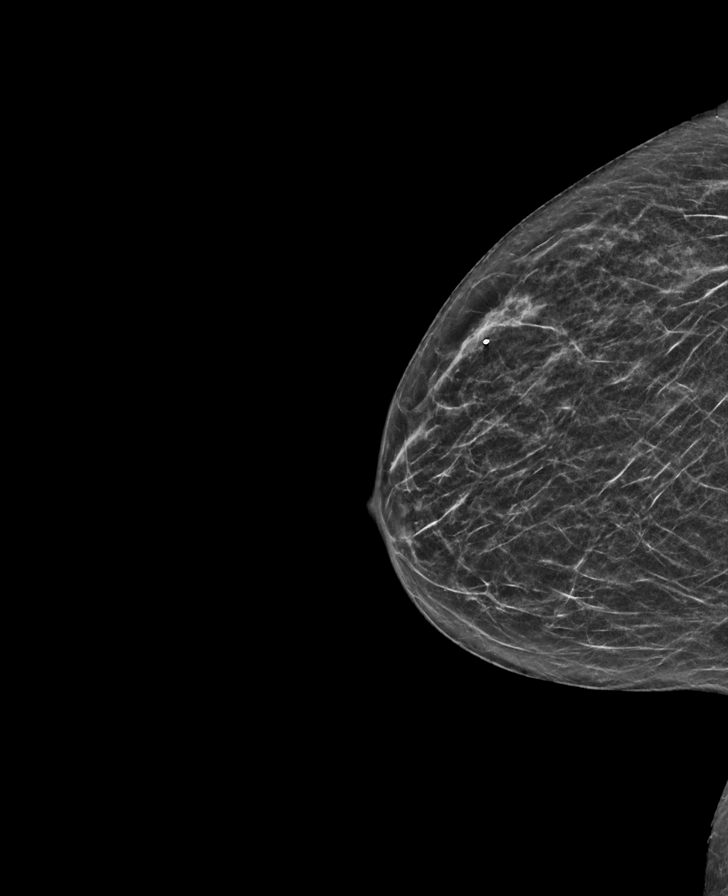

[L MLO synth-2D]
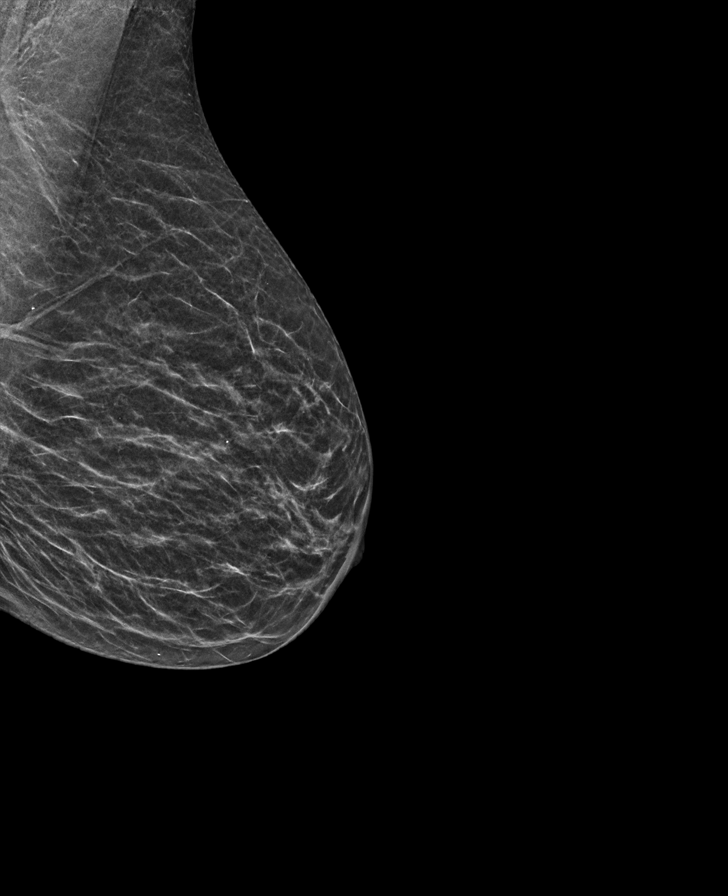

[R MLO synth-2D]
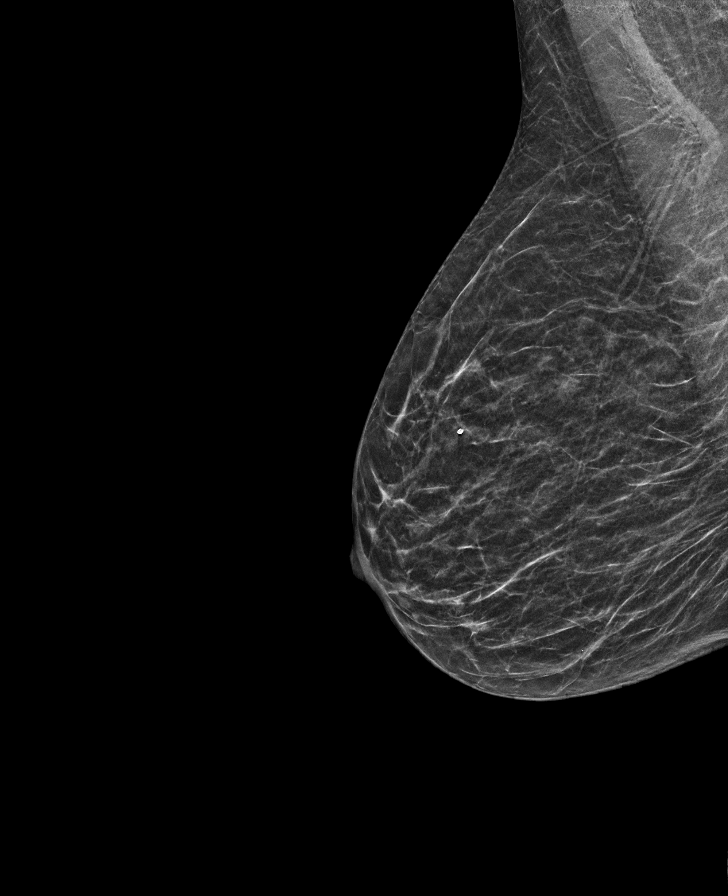

[L CC synth-2D]
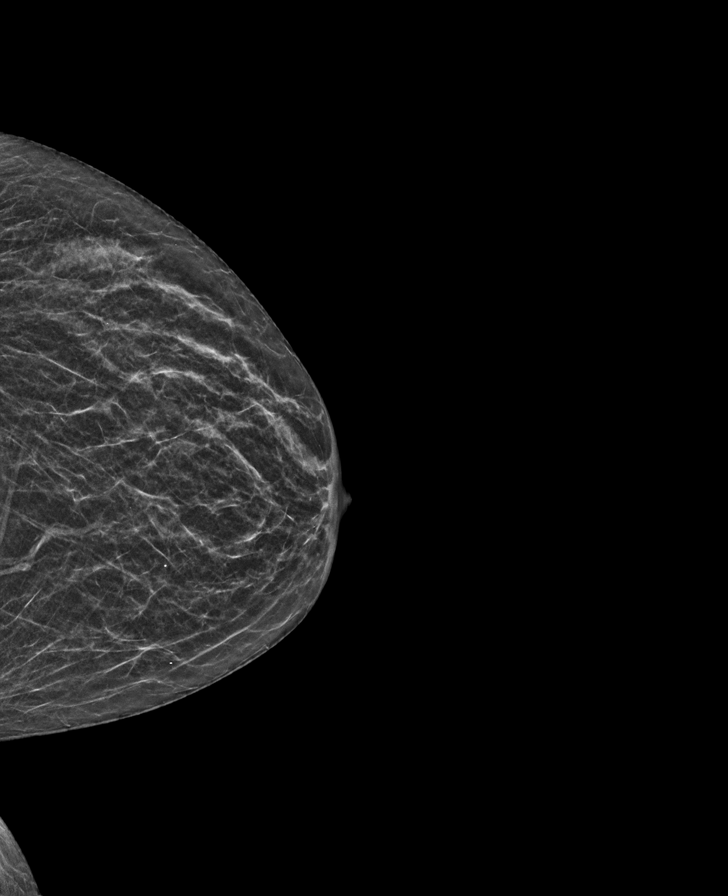

[L CC tomo · tomo slice 22/43.0]
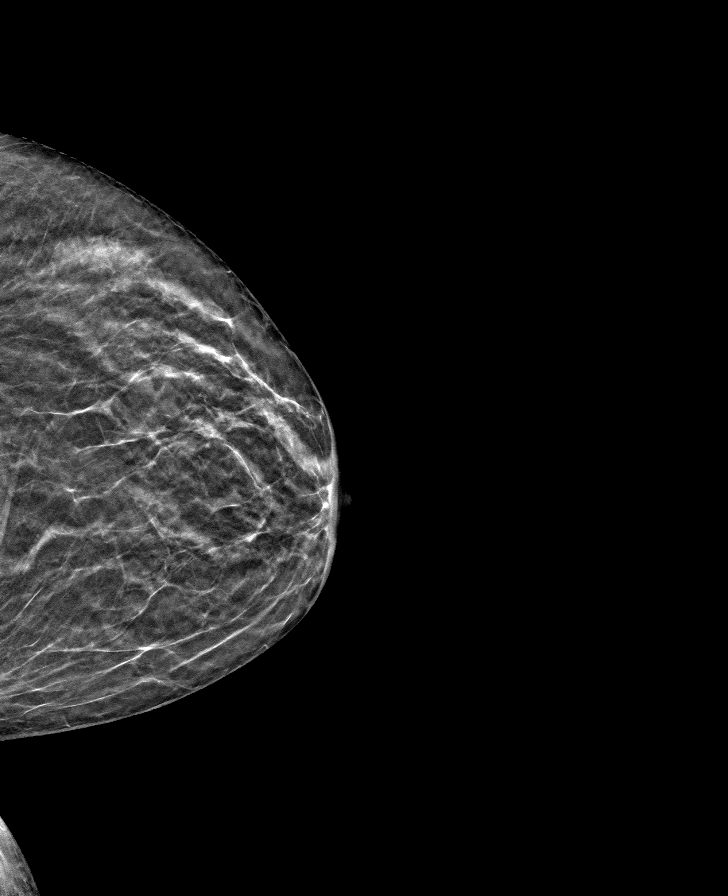

[L MLO tomo · tomo slice 21/42.0]
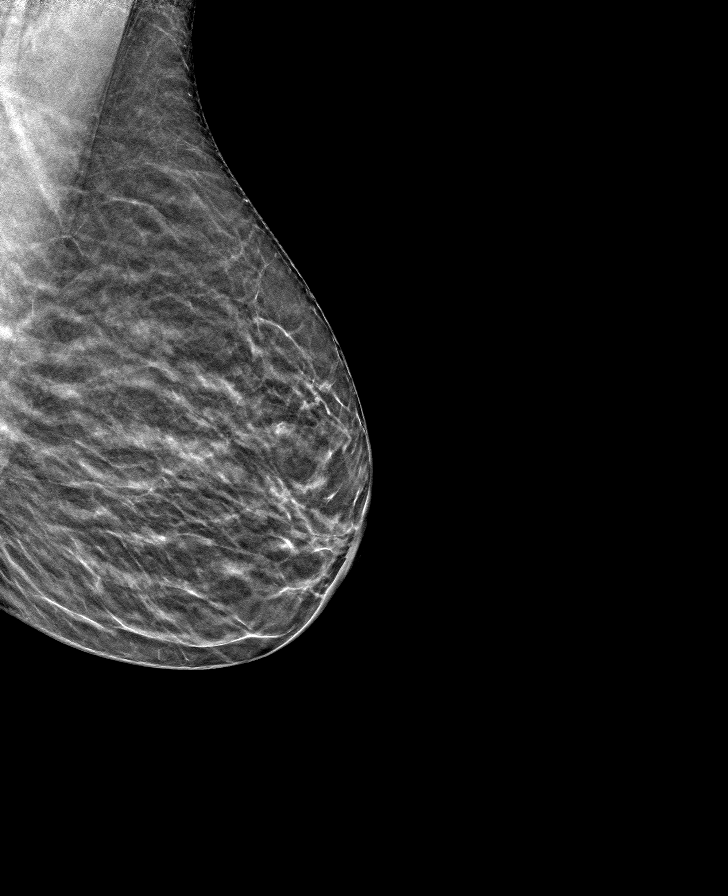

[R CC tomo · tomo slice 25/48.0]
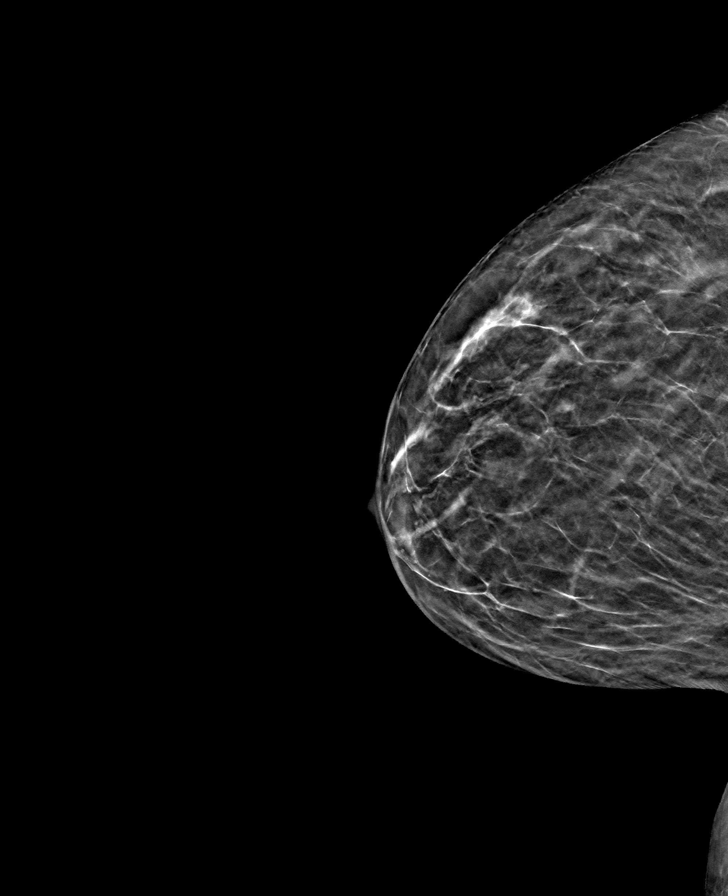

[R MLO tomo · tomo slice 21/40.0]
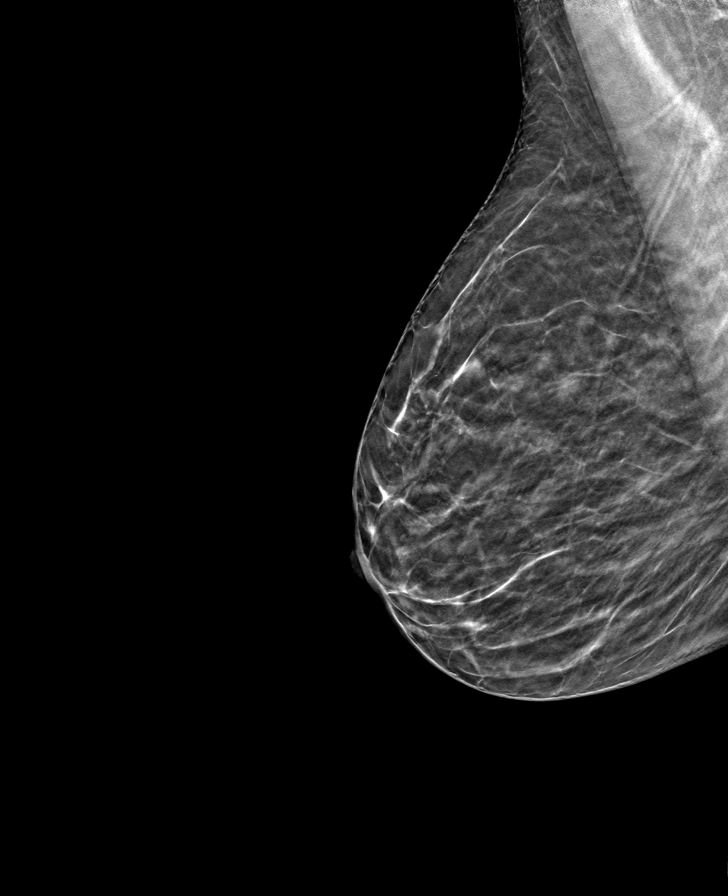

[8 of 24 positions shown; findings below may reference images not displayed]

ACR Breast Density Category b: There are scattered areas of
fibroglandular density.
FINDINGS: There are no findings suspicious for malignancy.
IMPRESSION: No mammographic evidence of malignancy. A result letter of this
screening mammogram will be mailed directly to the patient.

RECOMMENDATION:
Screening mammogram in one year. (Code:51-O-LD2)

BI-RADS CATEGORY  1: Negative.

## 2024-07-25 ENCOUNTER — Ambulatory Visit: Payer: BC Managed Care – PPO | Admitting: Dermatology

## 2024-07-26 ENCOUNTER — Ambulatory Visit (INDEPENDENT_AMBULATORY_CARE_PROVIDER_SITE_OTHER): Payer: BC Managed Care – PPO | Admitting: Dermatology

## 2024-07-26 ENCOUNTER — Encounter: Payer: Self-pay | Admitting: Dermatology

## 2024-07-26 DIAGNOSIS — L820 Inflamed seborrheic keratosis: Secondary | ICD-10-CM

## 2024-07-26 DIAGNOSIS — W908XXA Exposure to other nonionizing radiation, initial encounter: Secondary | ICD-10-CM

## 2024-07-26 DIAGNOSIS — L578 Other skin changes due to chronic exposure to nonionizing radiation: Secondary | ICD-10-CM | POA: Diagnosis not present

## 2024-07-26 DIAGNOSIS — I8390 Asymptomatic varicose veins of unspecified lower extremity: Secondary | ICD-10-CM

## 2024-07-26 DIAGNOSIS — Z808 Family history of malignant neoplasm of other organs or systems: Secondary | ICD-10-CM

## 2024-07-26 DIAGNOSIS — D229 Melanocytic nevi, unspecified: Secondary | ICD-10-CM

## 2024-07-26 DIAGNOSIS — L814 Other melanin hyperpigmentation: Secondary | ICD-10-CM

## 2024-07-26 DIAGNOSIS — D1801 Hemangioma of skin and subcutaneous tissue: Secondary | ICD-10-CM | POA: Diagnosis not present

## 2024-07-26 DIAGNOSIS — L821 Other seborrheic keratosis: Secondary | ICD-10-CM

## 2024-07-26 DIAGNOSIS — Z1283 Encounter for screening for malignant neoplasm of skin: Secondary | ICD-10-CM

## 2024-07-26 NOTE — Progress Notes (Signed)
 Follow-Up Visit   Subjective  Helen Trevino is a 63 y.o. female who presents for the following: Skin Cancer Screening and Full Body Skin Exam; no personal hx of skin cancer. Patient reports one area of concern on her back and one area of concern on her left wrist.   The patient presents for Total-Body Skin Exam (TBSE) for skin cancer screening and mole check. The patient has spots, moles and lesions to be evaluated, some may be new or changing and the patient may have concern these could be cancer.  The following portions of the chart were reviewed this encounter and updated as appropriate: medications, allergies, medical history  Review of Systems:  No other skin or systemic complaints except as noted in HPI or Assessment and Plan.  Objective  Well appearing patient in no apparent distress; mood and affect are within normal limits.  A full examination was performed including scalp, head, eyes, ears, nose, lips, neck, chest, axillae, abdomen, back, buttocks, bilateral upper extremities, bilateral lower extremities, hands, feet, fingers, toes, fingernails, and toenails. All findings within normal limits unless otherwise noted below.   Relevant physical exam findings are noted in the Assessment and Plan. Left forearm x1, Right hand x1, Right lower leg x1, Inter mammary area x7, Mid back x1 (11) Stuck on waxy paps with erythema  Assessment & Plan   SKIN CANCER SCREENING PERFORMED TODAY.  ACTINIC DAMAGE - Chronic condition, secondary to cumulative UV/sun exposure - diffuse scaly erythematous macules with underlying dyspigmentation - Recommend daily broad spectrum sunscreen SPF 30+ to sun-exposed areas, reapply every 2 hours as needed.  - Staying in the shade or wearing long sleeves, sun glasses (UVA+UVB protection) and wide brim hats (4-inch brim around the entire circumference of the hat) are also recommended for sun protection.  - Call for new or changing lesions.  LENTIGINES,  SEBORRHEIC KERATOSES, HEMANGIOMAS - Benign normal skin lesions - Benign-appearing - Call for any changes  MELANOCYTIC NEVI - Tan-brown and/or pink-flesh-colored symmetric macules and papules - Benign appearing on exam today - Observation - Call clinic for new or changing moles - Recommend daily use of broad spectrum spf 30+ sunscreen to sun-exposed areas.   Hemangiomas- under dermatoscopy Left anterior vertex scalp  - Red papules - Discussed benign nature - Observe - Call for any changes  Family history of melanoma Nephew  Family history of Melanoma  Varicose Veins/Spider Veins - Dilated blue, purple or red veins at the lower extremities - Reassured - Smaller vessels can be treated by sclerotherapy (a procedure to inject a medicine into the veins to make them disappear) if desired, but the treatment is not covered by insurance. Larger vessels may be covered if symptomatic and we would refer to vascular surgeon if treatment desired.  Cosmetic procedure $350 out of pocket per session  SEBORRHEIC KERATOSIS - Stuck-on, waxy, tan-brown papules and/or plaques on the face - Benign-appearing - Discussed benign etiology and prognosis. - Observe - Call for any changes - Recommend OTC Voltaren gel  - Discussed treatment with BBL laser and cosmetic charge of $60 for initial lesion and $15 of each additional lesion  INFLAMED SEBORRHEIC KERATOSIS (11) Left forearm x1, Right hand x1, Right lower leg x1, Inter mammary area x7, Mid back x1 (11) Symptomatic, irritating, patient would like treated. - Destruction of lesion - Left forearm x1, Right hand x1, Right lower leg x1, Inter mammary area x7, Mid back x1 (11) Complexity: simple   Destruction method: cryotherapy   Informed  consent: discussed and consent obtained   Timeout:  patient name, date of birth, surgical site, and procedure verified Lesion destroyed using liquid nitrogen: Yes   Region frozen until ice ball extended beyond lesion:  Yes   Outcome: patient tolerated procedure well with no complications   Post-procedure details: wound care instructions given    SEBORRHEIC KERATOSES (5) Right cheek x4, Left preauricular x1 (5) - Destruction of lesion - Right cheek x4, Left preauricular x1 (5) Complexity: simple   Destruction method: cryotherapy   Informed consent: discussed and consent obtained   Timeout:  patient name, date of birth, surgical site, and procedure verified Lesion destroyed using liquid nitrogen: Yes   Region frozen until ice ball extended beyond lesion: Yes   Outcome: patient tolerated procedure well with no complications   Post-procedure details: wound care instructions given    SKIN CANCER SCREENING   ACTINIC SKIN DAMAGE   FAMILY HISTORY OF SKIN CANCER   LENTIGO   MELANOCYTIC NEVUS, UNSPECIFIED LOCATION   HEMANGIOMA OF SKIN   Return in about 4 months (around 11/24/2024) for Cosmetic SK's.  I, Emerick Ege, CMA am acting as scribe for Alm Rhyme, MD.   Documentation: I have reviewed the above documentation for accuracy and completeness, and I agree with the above.  Alm Rhyme, MD

## 2024-07-26 NOTE — Patient Instructions (Addendum)
 Recommend daily broad spectrum sunscreen SPF 30+ to sun-exposed areas, reapply every 2 hours as needed. Call for new or changing lesions.  Staying in the shade or wearing long sleeves, sun glasses (UVA+UVB protection) and wide brim hats (4-inch brim around the entire circumference of the hat) are also recommended for sun protection.     Melanoma ABCDEs  Melanoma is the most dangerous type of skin cancer, and is the leading cause of death from skin disease.  You are more likely to develop melanoma if you: Have light-colored skin, light-colored eyes, or red or blond hair Spend a lot of time in the sun Tan regularly, either outdoors or in a tanning bed Have had blistering sunburns, especially during childhood Have a close family member who has had a melanoma Have atypical moles or large birthmarks  Early detection of melanoma is key since treatment is typically straightforward and cure rates are extremely high if we catch it early.   The first sign of melanoma is often a change in a mole or a new dark spot.  The ABCDE system is a way of remembering the signs of melanoma.  A for asymmetry:  The two halves do not match. B for border:  The edges of the growth are irregular. C for color:  A mixture of colors are present instead of an even brown color. D for diameter:  Melanomas are usually (but not always) greater than 6mm - the size of a pencil eraser. E for evolution:  The spot keeps changing in size, shape, and color.  Please check your skin once per month between visits. You can use a small mirror in front and a large mirror behind you to keep an eye on the back side or your body.   If you see any new or changing lesions before your next follow-up, please call to schedule a visit.  Please continue daily skin protection including broad spectrum sunscreen SPF 30+ to sun-exposed areas, reapplying every 2 hours as needed when you're outdoors.    Due to recent changes in healthcare laws, you  may see results of your pathology and/or laboratory studies on MyChart before the doctors have had a chance to review them. We understand that in some cases there may be results that are confusing or concerning to you. Please understand that not all results are received at the same time and often the doctors may need to interpret multiple results in order to provide you with the best plan of care or course of treatment. Therefore, we ask that you please give us  2 business days to thoroughly review all your results before contacting the office for clarification. Should we see a critical lab result, you will be contacted sooner.   If You Need Anything After Your Visit  If you have any questions or concerns for your doctor, please call our main line at [phone] and press option 4 to reach your doctor's medical assistant. If no one answers, please leave a voicemail as directed and we will return your call as soon as possible. Messages left after 4 pm will be answered the following business day.   You may also send us  a message via MyChart. We typically respond to MyChart messages within 1-2 business days.  For prescription refills, please ask your pharmacy to contact our office. Our fax number is [phone].  If you have an urgent issue when the clinic is closed that cannot wait until the next business day, you can page your doctor at  the number below.    Please note that while we do our best to be available for urgent issues outside of office hours, we are not available 24/7.   If you have an urgent issue and are unable to reach us , you may choose to seek medical care at your doctor's office, retail clinic, urgent care center, or emergency room.  If you have a medical emergency, please immediately call 911 or go to the emergency department.  Pager Numbers  - Dr. Hester: [phone]  - Dr. Jackquline: [phone]  - Dr. Claudene: [phone]   - Dr. Raymund: [phone]  In the event of  inclement weather, please call our main line at [phone] for an update on the status of any delays or closures.  Dermatology Medication Tips: Please keep the boxes that topical medications come in in order to help keep track of the instructions about where and how to use these. Pharmacies typically print the medication instructions only on the boxes and not directly on the medication tubes.   If your medication is too expensive, please contact our office at [phone] option 4 or send us  a message through MyChart.   We are unable to tell what your co-pay for medications will be in advance as this is different depending on your insurance coverage. However, we may be able to find a substitute medication at lower cost or fill out paperwork to get insurance to cover a needed medication.   If a prior authorization is required to get your medication covered by your insurance company, please allow us  1-2 business days to complete this process.  Drug prices often vary depending on where the prescription is filled and some pharmacies may offer cheaper prices.  The website www.goodrx.com contains coupons for medications through different pharmacies. The prices here do not account for what the cost may be with help from insurance (it may be cheaper with your insurance), but the website can give you the price if you did not use any insurance.  - You can print the associated coupon and take it with your prescription to the pharmacy.  - You may also stop by our office during regular business hours and pick up a GoodRx coupon card.  - If you need your prescription sent electronically to a different pharmacy, notify our office through [facility] or by phone at [phone] option 4.     Si Usted Necesita Algo Despus de Su Visita  Tambin puede enviarnos un mensaje a travs de Clinical cytogeneticist. Por lo general respondemos a los mensajes de MyChart en el transcurso de 1 a 2 das hbiles.  Para renovar  recetas, por favor pida a su farmacia que se ponga en contacto con nuestra oficina. Randi lakes de fax es [place] [phone].  Si tiene un asunto urgente cuando la clnica est cerrada y que no puede esperar hasta el siguiente da hbil, puede llamar/localizar a su doctor(a) al nmero que aparece a continuacin.   Por favor, tenga en cuenta que aunque hacemos todo lo posible para estar disponibles para asuntos urgentes fuera del horario de [place], no estamos disponibles las 24 horas del da, los 7 [address]  [address] de la [place].   Si tiene un problema urgente y no puede comunicarse con nosotros, puede optar por buscar atencin mdica  en el consultorio de su doctor(a), en una clnica privada, en un centro de atencin urgente o en una sala de emergencias.  Si tiene una emergencia mdica, por favor llame inmediatamente al 911 o vaya a  la sala de emergencias.  Nmeros de bper  - Dr. Hester: [phone]  - Dra. Jackquline: [phone]  - Dr. Claudene: [phone]  - Dra. Kitts: [phone]  En caso de inclemencias del [place], por favor llame a nuestra lnea principal al [phone] para una actualizacin sobre el estado de cualquier retraso o cierre.  Consejos para la medicacin en dermatologa: Por favor, guarde las cajas en las que vienen los medicamentos de uso tpico para ayudarle a seguir las instrucciones sobre dnde y cmo usarlos. Las farmacias generalmente imprimen las instrucciones del medicamento slo en las cajas y no directamente en los tubos del [place].   Si su medicamento es muy caro, por favor, pngase en contacto con landry rieger llamando al [phone] y presione la opcin 4 o envenos un mensaje a travs de Clinical cytogeneticist.   No podemos decirle cul ser su copago por los medicamentos por adelantado ya que esto es diferente dependiendo de la cobertura de su seguro. Sin embargo, es posible que podamos encontrar un medicamento sustituto a Audiological scientist un formulario para que el  seguro cubra el medicamento que se considera necesario.   Si se requiere una autorizacin previa para que su compaa de seguros malta su medicamento, por favor permtanos de 1 a 2 das hbiles para completar este proceso.  Los precios de los medicamentos varan con frecuencia dependiendo del Environmental consultant de dnde se surte la receta y alguna farmacias pueden ofrecer precios ms baratos.  El sitio web www.goodrx.com tiene cupones para medicamentos de Health and safety inspector. Los precios aqu no tienen en cuenta lo que podra costar con la ayuda del seguro (puede ser ms barato con su seguro), pero el sitio web puede darle el precio si no utiliz Tourist information centre manager.  - Puede imprimir el cupn correspondiente y llevarlo con su receta a la farmacia.  - Tambin puede pasar por nuestra oficina durante el horario de atencin regular y Education officer, museum una tarjeta de cupones de GoodRx.  - Si necesita que su receta se enve electrnicamente a una farmacia diferente, informe a nuestra oficina a travs de MyChart de [place] o por telfono llamando al [phone] y presione la opcin 4.

## 2024-09-03 ENCOUNTER — Other Ambulatory Visit: Payer: Self-pay | Admitting: Physician Assistant

## 2024-09-03 DIAGNOSIS — Z1231 Encounter for screening mammogram for malignant neoplasm of breast: Secondary | ICD-10-CM

## 2024-09-28 ENCOUNTER — Encounter

## 2024-11-29 ENCOUNTER — Ambulatory Visit: Admitting: Dermatology
